# Patient Record
Sex: Female | Born: 1975 | Race: White | Hispanic: No | Marital: Married | Smoking: Never smoker
Health system: Southern US, Community
[De-identification: ages and names within clinical notes are randomized; demographics above are authoritative.]

## PROBLEM LIST (undated history)

## (undated) DIAGNOSIS — K589 Irritable bowel syndrome without diarrhea: Secondary | ICD-10-CM

## (undated) DIAGNOSIS — J189 Pneumonia, unspecified organism: Secondary | ICD-10-CM

## (undated) DIAGNOSIS — F329 Major depressive disorder, single episode, unspecified: Secondary | ICD-10-CM

## (undated) DIAGNOSIS — I10 Essential (primary) hypertension: Secondary | ICD-10-CM

## (undated) DIAGNOSIS — F32A Depression, unspecified: Secondary | ICD-10-CM

## (undated) DIAGNOSIS — D219 Benign neoplasm of connective and other soft tissue, unspecified: Secondary | ICD-10-CM

## (undated) DIAGNOSIS — F419 Anxiety disorder, unspecified: Secondary | ICD-10-CM

## (undated) DIAGNOSIS — O09519 Supervision of elderly primigravida, unspecified trimester: Secondary | ICD-10-CM

## (undated) HISTORY — DX: Supervision of elderly primigravida, unspecified trimester: O09.519

## (undated) HISTORY — DX: Irritable bowel syndrome, unspecified: K58.9

## (undated) HISTORY — DX: Benign neoplasm of connective and other soft tissue, unspecified: D21.9

## (undated) HISTORY — PX: LUNG SURGERY: SHX703

## (undated) HISTORY — DX: Anxiety disorder, unspecified: F41.9

## (undated) HISTORY — DX: Essential (primary) hypertension: I10

## (undated) HISTORY — PX: WISDOM TOOTH EXTRACTION: SHX21

---

## 2011-05-23 ENCOUNTER — Encounter: Payer: Self-pay | Admitting: Internal Medicine

## 2011-05-23 ENCOUNTER — Ambulatory Visit (INDEPENDENT_AMBULATORY_CARE_PROVIDER_SITE_OTHER): Payer: Managed Care, Other (non HMO) | Admitting: Internal Medicine

## 2011-05-23 ENCOUNTER — Ambulatory Visit (INDEPENDENT_AMBULATORY_CARE_PROVIDER_SITE_OTHER)
Admission: RE | Admit: 2011-05-23 | Discharge: 2011-05-23 | Disposition: A | Discharge status: Home / Self Care | Payer: Managed Care, Other (non HMO) | Source: Ambulatory Visit | Attending: Internal Medicine | Admitting: Internal Medicine

## 2011-05-23 ENCOUNTER — Other Ambulatory Visit (INDEPENDENT_AMBULATORY_CARE_PROVIDER_SITE_OTHER): Payer: Managed Care, Other (non HMO)

## 2011-05-23 VITALS — BP 132/86 | HR 121 | Temp 100.2°F | Ht 63.0 in | Wt 162.6 lb

## 2011-05-23 DIAGNOSIS — J90 Pleural effusion, not elsewhere classified: Secondary | ICD-10-CM

## 2011-05-23 LAB — SEDIMENTATION RATE: Sed Rate: 116 mm/hr — ABNORMAL HIGH (ref 0–22)

## 2011-05-23 LAB — BASIC METABOLIC PANEL
Chloride: 97 mEq/L (ref 96–112)
Creatinine, Ser: 0.6 mg/dL (ref 0.4–1.2)
Sodium: 135 mEq/L (ref 135–145)

## 2011-05-23 LAB — CBC WITH DIFFERENTIAL/PLATELET
Eosinophils Absolute: 0.2 10*3/uL (ref 0.0–0.7)
Lymphocytes Relative: 7.9 % — ABNORMAL LOW (ref 12.0–46.0)
MCHC: 32.8 g/dL (ref 30.0–36.0)
MCV: 79.9 fl (ref 78.0–100.0)
Monocytes Absolute: 1.4 10*3/uL — ABNORMAL HIGH (ref 0.1–1.0)
Neutrophils Relative %: 83.4 % — ABNORMAL HIGH (ref 43.0–77.0)
Platelets: 787 10*3/uL — ABNORMAL HIGH (ref 150.0–400.0)
WBC: 19.5 10*3/uL (ref 4.5–10.5)

## 2011-05-23 MED ORDER — IOHEXOL 300 MG/ML  SOLN
80.0000 mL | Freq: Once | INTRAMUSCULAR | Status: AC | PRN
  Administered 2011-05-23: 80 mL via INTRAVENOUS

## 2011-05-23 NOTE — Patient Instructions (Signed)
Please see patient coordinator before you leave today  to schedule ct chest asap  Please remember to go to the lab  department downstairs for your tests - we will call you with the results when they are available.  Most likely ultrasound guided thoracentesis will be done tomorrow after we have a change to review your scan

## 2011-05-23 NOTE — Assessment & Plan Note (Addendum)
-   CT chest 05/23/2011 >1. Lingular and left lower lobe pneumonia with associated empyema.  Necrosis in the lingula is suspected. Follow-up to clearing is  recommended.  2. Small pericardial effusion.  3. Small right pleural effusion.   - ESR  116 05/23/2011   Probable CAP with parapneumonic effusion ? Complex vs frank empyema in pt who was on prednisone for UC at onset with outside possibility of UC of the lung less likely.  There is a rind that is thin at this point and probably needs early rather than late vats - discussed with Dr Edwyna Shell who kindly agreed to see her in the office 4/18.

## 2011-05-23 NOTE — Progress Notes (Signed)
  Subjective:    Patient ID: Taylor [name], female    DOB: Dec 18, 1975  MRN: [id number]  HPI  36 yowf never smoker with h/o watery eyes in pollen seasonx  many years and UC since age 36 with occ  flares requiring prednisone maybe once a year with last flare requiring which was stopped on May 16 2011  referred by by [facility] 05/23/2011 for dx of pneumonia.   05/23/2011 1st pulmonary ov/ last of March 2013  started coughing with mostly clear mucus then first April abupt onset  L ant / lat pleurtic CP April 4th dx pna rx avelox x 10 days seemed to help some with sob/ wheezing but cough only some better and no real change in fever pattern with cxr worse 05/23/2011  Per report with fluid present.  No sore throat, sinus complaints, dysphagia or nausea but poor appetite.    No dental work, no unusual exposure.   Sleeping ok without nocturnal  or early am exacerbation  of respiratory  C/o's  Also denies any obvious fluctuation of symptoms with weather or environmental changes or other aggravating or alleviating factors except as outlined above   Review of Systems  Constitutional: Positive for fever. Negative for unexpected weight change.  HENT: Positive for rhinorrhea. Negative for ear pain, nosebleeds, congestion, sore throat, sneezing, trouble swallowing, dental problem, postnasal drip and sinus pressure.   Eyes: Negative for redness and itching.  Respiratory: Positive for cough, chest tightness, shortness of breath and wheezing.   Cardiovascular: Negative for chest pain, palpitations and leg swelling.  Gastrointestinal: Negative for nausea and vomiting.  Genitourinary: Negative for dysuria.  Musculoskeletal: Negative for joint swelling.  Skin: Negative for rash.  Neurological: Negative for headaches.  Hematological: Does not bruise/bleed easily.  Psychiatric/Behavioral: Negative for dysphoric mood. The patient is not nervous/anxious.        Objective:   Physical Exam   Pleasant amb wf  nad  Wt 162 05/23/2011   HEENT: nl dentition, turbinates, and orophanx. Nl external ear canals without cough reflex   NECK :  without JVD/Nodes/TM/ nl carotid upstrokes bilaterally   LUNGS: no acc muscle use, no cough on insp or exp maneuvers, decreased bs with dullness L base extending one third to one half way up   CV:  RRR  no s3 or murmur or increase in P2, no edema   ABD:  soft and nontender with nl excursion in the supine position. No bruits or organomegaly, bowel sounds nl  MS:  warm without deformities, calf tenderness, cyanosis or clubbing  SKIN: warm and dry without lesions    NEURO:  alert, approp, no deficits     cxr 05/23/2011 reported to show large L effusion  Assessment & Plan:

## 2011-05-24 ENCOUNTER — Ambulatory Visit (INDEPENDENT_AMBULATORY_CARE_PROVIDER_SITE_OTHER): Payer: Managed Care, Other (non HMO) | Admitting: Thoracic Surgery

## 2011-05-24 ENCOUNTER — Encounter: Payer: Self-pay | Admitting: Thoracic Surgery

## 2011-05-24 DIAGNOSIS — J869 Pyothorax without fistula: Secondary | ICD-10-CM

## 2011-05-24 NOTE — Progress Notes (Signed)
PCP is Beverley Fiedler, MD, MD Referring Provider is Nyoka Cowden, MD  Chief Complaint  Patient presents with  . Empyema    eval and treat    HPI: This 36 year old Caucasian female has a long history of ulcerative colitis since age 68. Recently he was placed on prednisone in addition to [place]. She developed upper respiratory infection and went to her primary care physician where she was found to have pneumonia on chest x-ray. This pneumonia was in the left lower lobe. She was treated with Avelox but continued to have temperature and returned to her primary care physician. Repeat chest x-ray revealed a fluid was loculations on the left side CT scan shows a loculated left pleural effusion. She's this continues to have fever with a temperature 101 today the CT scan is compatible with a subacute to chronic empyema with a thickened pleural rind and entrapment of the left lower lobe. She is referred here for treatment Past Medical History  Diagnosis Date  . Ulcerative colitis     Past Surgical History  Procedure Date  . Wisdom tooth extraction     Family History  Problem Relation Age of Onset  . Emphysema Paternal Grandfather   . Emphysema Paternal Grandmother   . Pancreatic cancer Maternal Grandmother   . Lung cancer Paternal Grandfather   . Lung cancer Paternal Grandmother     Social History History  Substance Use Topics  . Smoking status: Never Smoker   . Smokeless tobacco: Never Used  . Alcohol Use: Yes     rarely    Current Outpatient Prescriptions  Medication Sig Dispense Refill  . acetaminophen (TYLENOL) 500 MG tablet Take 500 mg by mouth as needed.      Marland Kitchen FLUoxetine (PROZAC) 40 MG capsule Take 40 mg by mouth daily.      . mesalamine (LIALDA) 1.2 G EC tablet Take 2,400 mg by mouth daily with breakfast.      . Multiple Vitamins-Minerals (MULTIVITAMIN WITH MINERALS) tablet Take 1 tablet by mouth daily.       No current facility-administered medications for this visit.    Facility-Administered Medications Ordered in Other Visits  Medication Dose Route Frequency Provider Last Rate Last Dose  . iohexol (OMNIPAQUE) 300 MG/ML solution 80 mL  80 mL Intravenous Once PRN Medication Radiologist, MD   80 mL at 05/23/11 1532    No Known Allergies  Review of Systems  Constitutional: Positive for fever and chills.  HENT: Positive for congestion.   Eyes: Negative.   Respiratory: Positive for cough, chest tightness and shortness of breath.   Cardiovascular: Negative.   Gastrointestinal: Positive for diarrhea.  Genitourinary: Negative.   Musculoskeletal: Positive for arthralgias.  Skin: Negative.   Neurological: Negative.   Hematological: Negative.   Psychiatric/Behavioral: Negative.     BP 135/96  Pulse 122  Temp(Src) 101.1 F (38.4 C) (Oral)  Resp 16  Ht 5\' 3"  (1.6 m)  Wt 160 lb (72.576 kg)  BMI 28.34 kg/m2  SpO2 95%  LMP 05/18/2011 Physical Exam  Constitutional: She is oriented to person, place, and time. She appears well-developed and well-nourished.  HENT:  Head: Normocephalic and atraumatic.  Right Ear: External ear normal.  Left Ear: External ear normal.  Nose: Nose normal.  Mouth/Throat: No oropharyngeal exudate.  Eyes: Conjunctivae are normal. Pupils are equal, round, and reactive to light.  Neck: Normal range of motion. Neck supple.  Cardiovascular: Normal rate, normal heart sounds and intact distal pulses.   Pulmonary/Chest: She has decreased breath  sounds in the left middle field and the left lower field.  Abdominal: Soft. Bowel sounds are normal.  Musculoskeletal: Normal range of motion.  Neurological: She is alert and oriented to person, place, and time. She has normal reflexes.  Skin: Skin is warm and dry.  Psychiatric: She has a normal mood and affect. Her behavior is normal. Judgment and thought content normal.     Diagnostic Tests: CT scan showed loculated left pleural effusion pleural collapse of the left lower lobe  consistent with a left empyema   Impression: Lobe pneumonia with resultant empyema history of ulcerative colitis   Plan: Admission to hospital for intravenous antibiotics left VATS with decortication

## 2011-05-25 ENCOUNTER — Inpatient Hospital Stay (HOSPITAL_COMMUNITY): Payer: Managed Care, Other (non HMO)

## 2011-05-25 ENCOUNTER — Encounter (HOSPITAL_COMMUNITY): Payer: Self-pay | Admitting: *Deleted

## 2011-05-25 ENCOUNTER — Encounter (HOSPITAL_COMMUNITY): Payer: Self-pay | Admitting: Pharmacy Technician

## 2011-05-25 ENCOUNTER — Inpatient Hospital Stay (HOSPITAL_COMMUNITY): Admission: RE | Admit: 2011-05-25 | Payer: Managed Care, Other (non HMO) | Source: Ambulatory Visit

## 2011-05-25 ENCOUNTER — Inpatient Hospital Stay (HOSPITAL_COMMUNITY)
Admission: RE | Admit: 2011-05-25 | Discharge: 2011-06-03 | DRG: 163 | Disposition: A | Discharge status: Home Health | Payer: Managed Care, Other (non HMO) | Source: Ambulatory Visit | Attending: Thoracic Surgery | Admitting: Thoracic Surgery

## 2011-05-25 DIAGNOSIS — E876 Hypokalemia: Secondary | ICD-10-CM | POA: Diagnosis not present

## 2011-05-25 DIAGNOSIS — I319 Disease of pericardium, unspecified: Secondary | ICD-10-CM | POA: Diagnosis present

## 2011-05-25 DIAGNOSIS — Z801 Family history of malignant neoplasm of trachea, bronchus and lung: Secondary | ICD-10-CM

## 2011-05-25 DIAGNOSIS — K5190 Ulcerative colitis, unspecified, without complications: Secondary | ICD-10-CM | POA: Diagnosis present

## 2011-05-25 DIAGNOSIS — J869 Pyothorax without fistula: Principal | ICD-10-CM | POA: Diagnosis present

## 2011-05-25 DIAGNOSIS — J15212 Pneumonia due to Methicillin resistant Staphylococcus aureus: Secondary | ICD-10-CM | POA: Diagnosis present

## 2011-05-25 DIAGNOSIS — Z79899 Other long term (current) drug therapy: Secondary | ICD-10-CM

## 2011-05-25 DIAGNOSIS — Z800 Family history of malignant neoplasm of digestive organs: Secondary | ICD-10-CM

## 2011-05-25 HISTORY — DX: Major depressive disorder, single episode, unspecified: F32.9

## 2011-05-25 HISTORY — DX: Depression, unspecified: F32.A

## 2011-05-25 LAB — CBC
HCT: 34.5 % — ABNORMAL LOW (ref 36.0–46.0)
Hemoglobin: 11.6 g/dL — ABNORMAL LOW (ref 12.0–15.0)
MCH: 26.5 pg (ref 26.0–34.0)
MCHC: 33.6 g/dL (ref 30.0–36.0)
MCV: 78.9 fL (ref 78.0–100.0)
RDW: 13.8 % (ref 11.5–15.5)

## 2011-05-25 LAB — SURGICAL PCR SCREEN
MRSA, PCR: POSITIVE — AB
Staphylococcus aureus: POSITIVE — AB

## 2011-05-25 LAB — URINALYSIS, ROUTINE W REFLEX MICROSCOPIC
Bilirubin Urine: NEGATIVE
Glucose, UA: NEGATIVE mg/dL
Hgb urine dipstick: NEGATIVE
Ketones, ur: NEGATIVE mg/dL
Nitrite: NEGATIVE
Specific Gravity, Urine: 1.018 (ref 1.005–1.030)
pH: 6.5 (ref 5.0–8.0)

## 2011-05-25 LAB — ABO/RH: ABO/RH(D): O POS

## 2011-05-25 LAB — PROTIME-INR: INR: 1.15 (ref 0.00–1.49)

## 2011-05-25 LAB — COMPREHENSIVE METABOLIC PANEL
Albumin: 2.5 g/dL — ABNORMAL LOW (ref 3.5–5.2)
BUN: 15 mg/dL (ref 6–23)
Creatinine, Ser: 0.6 mg/dL (ref 0.50–1.10)
GFR calc Af Amer: >90 mL/min (ref >90)
Glucose, Bld: 188 mg/dL — ABNORMAL HIGH (ref 70–99)
Total Bilirubin: 0.2 mg/dL — ABNORMAL LOW (ref 0.3–1.2)
Total Protein: 7.7 g/dL (ref 6.0–8.3)

## 2011-05-25 MED ORDER — ONDANSETRON HCL 4 MG/2ML IJ SOLN: 4.0000 mg | Freq: Four times a day (QID) | INTRAMUSCULAR | Status: DC | PRN

## 2011-05-25 MED ORDER — ACETAMINOPHEN 650 MG RE SUPP: 650.0000 mg | Freq: Four times a day (QID) | RECTAL | Status: DC | PRN

## 2011-05-25 MED ORDER — ACETAMINOPHEN 325 MG PO TABS
650.0000 mg | ORAL_TABLET | Freq: Four times a day (QID) | ORAL | Status: DC | PRN
  Administered 2011-05-25 (×2): 650 mg via ORAL
  Filled 2011-05-25 (×2): qty 2

## 2011-05-25 MED ORDER — PIPERACILLIN-TAZOBACTAM 3.375 G IVPB
3.3750 g | Freq: Three times a day (TID) | INTRAVENOUS | Status: DC
  Administered 2011-05-25 – 2011-05-28 (×10): 3.375 g via INTRAVENOUS
  Filled 2011-05-25 (×12): qty 50

## 2011-05-25 MED ORDER — FLUOXETINE HCL 20 MG PO CAPS
40.0000 mg | ORAL_CAPSULE | Freq: Every day | ORAL | Status: DC
  Filled 2011-05-25: qty 2

## 2011-05-25 MED ORDER — FENTANYL CITRATE 0.05 MG/ML IJ SOLN
50.0000 ug | INTRAMUSCULAR | Status: DC | PRN
  Administered 2011-05-31: 50 ug via INTRAVENOUS
  Filled 2011-05-25: qty 2

## 2011-05-25 MED ORDER — VANCOMYCIN HCL 1000 MG IV SOLR
750.0000 mg | Freq: Three times a day (TID) | INTRAVENOUS | Status: DC
  Administered 2011-05-25 – 2011-05-26 (×2): 750 mg via INTRAVENOUS
  Filled 2011-05-25 (×6): qty 750

## 2011-05-25 MED ORDER — DEXTROSE-NACL 5-0.45 % IV SOLN
INTRAVENOUS | Status: DC
  Administered 2011-05-25: 16:00:00 via INTRAVENOUS

## 2011-05-25 MED ORDER — ZOLPIDEM TARTRATE 5 MG PO TABS
5.0000 mg | ORAL_TABLET | Freq: Every evening | ORAL | Status: DC | PRN
  Administered 2011-05-25: 5 mg via ORAL
  Filled 2011-05-25: qty 1

## 2011-05-25 MED ORDER — MESALAMINE 1.2 G PO TBEC
2.4000 g | DELAYED_RELEASE_TABLET | Freq: Every day | ORAL | Status: DC
  Filled 2011-05-25: qty 2

## 2011-05-25 MED ORDER — FLUOXETINE HCL 20 MG PO CAPS
40.0000 mg | ORAL_CAPSULE | Freq: Every day | ORAL | Status: DC
  Administered 2011-05-26 – 2011-06-03 (×9): 40 mg via ORAL
  Filled 2011-05-25 (×9): qty 2

## 2011-05-25 MED ORDER — ONDANSETRON HCL 4 MG PO TABS
4.0000 mg | ORAL_TABLET | Freq: Four times a day (QID) | ORAL | Status: DC | PRN
  Administered 2011-06-02 – 2011-06-03 (×2): 4 mg via ORAL
  Filled 2011-05-25 (×2): qty 1

## 2011-05-25 MED ORDER — MUPIROCIN 2 % EX OINT
1.0000 "application " | TOPICAL_OINTMENT | Freq: Two times a day (BID) | CUTANEOUS | Status: AC
  Administered 2011-05-25 – 2011-05-30 (×9): 1 via NASAL
  Filled 2011-05-25 (×2): qty 22

## 2011-05-25 MED ORDER — FENTANYL CITRATE 0.05 MG/ML IJ SOLN: 50.0000 ug | INTRAMUSCULAR | Status: DC | PRN

## 2011-05-25 MED ORDER — MESALAMINE 1.2 G PO TBEC
2.4000 g | DELAYED_RELEASE_TABLET | Freq: Every day | ORAL | Status: DC
  Administered 2011-05-27 – 2011-06-03 (×8): 2.4 g via ORAL
  Filled 2011-05-25 (×10): qty 2

## 2011-05-25 MED ORDER — HYDROCODONE-ACETAMINOPHEN 5-325 MG PO TABS
1.0000 | ORAL_TABLET | ORAL | Status: DC | PRN
  Administered 2011-05-29 – 2011-06-01 (×3): 2 via ORAL
  Filled 2011-05-25 (×3): qty 2

## 2011-05-25 MED ORDER — CHLORHEXIDINE GLUCONATE CLOTH 2 % EX PADS
6.0000 | MEDICATED_PAD | Freq: Every day | CUTANEOUS | Status: DC
  Administered 2011-05-26: 6 via TOPICAL

## 2011-05-25 MED ORDER — MIDAZOLAM HCL 2 MG/2ML IJ SOLN: 1.0000 mg | INTRAMUSCULAR | Status: DC | PRN

## 2011-05-25 NOTE — Progress Notes (Signed)
ANTIBIOTIC CONSULT NOTE - INITIAL  Pharmacy Consult for Vancomycin Indication: pneumonia with resultant empyema  No Known Allergies  Patient Measurements: Height: 5\' 3"  (160 cm) Weight: 147 lb 11.3 oz (67 kg) IBW/kg (Calculated) : 52.4    Vital Signs: Temp: 100.2 F (37.9 C) (04/19 1331) BP: 128/98 mmHg (04/19 1331) Pulse Rate: 115  (04/19 1331) Intake/Output from previous day:   Intake/Output from this shift:    Labs:  Basename 05/23/11 1417  WBC 19.5 Repeated and verified X2.*  HGB 10.5*  PLT 787.0 Repeated and verified X2.*  LABCREA --  CREATININE 0.6   Estimated Creatinine Clearance: 87.6 ml/min (by C-G formula based on Cr of 0.6). No results found for this basename: VANCOTROUGH:2,VANCOPEAK:2,VANCORANDOM:2,GENTTROUGH:2,GENTPEAK:2,GENTRANDOM:2,TOBRATROUGH:2,TOBRAPEAK:2,TOBRARND:2,AMIKACINPEAK:2,AMIKACINTROU:2,AMIKACIN:2, in the last 72 hours   Microbiology: No results found for this or any previous visit (from the past 720 hour(s)).  Medical History: Past Medical History  Diagnosis Date  . Ulcerative colitis     Medications:  Prescriptions prior to admission  Medication Sig Dispense Refill  . FLUoxetine (PROZAC) 40 MG capsule Take 40 mg by mouth daily.      Marland Kitchen ibuprofen (ADVIL,MOTRIN) 200 MG tablet Take 400 mg by mouth every 6 (six) hours as needed. For pain      . mesalamine (LIALDA) 1.2 G EC tablet Take 2,400 mg by mouth daily with breakfast.       Scheduled:    . FLUoxetine  40 mg Oral Daily  . mesalamine  2.4 g Oral Q breakfast  . piperacillin-tazobactam (ZOSYN)  IV  3.375 g Intravenous Q8H  . DISCONTD: FLUoxetine  40 mg Oral Daily  . DISCONTD: mesalamine  2.4 g Oral Q breakfast   Anti-infectives     Start     Dose/Rate Route Frequency Ordered Stop   05/25/11 1500   piperacillin-tazobactam (ZOSYN) IVPB 3.375 g        3.375 g 12.5 mL/hr over 240 Minutes Intravenous 3 times per day 05/25/11 1350           Assessment: 36 y.o. Female with long  h/o ulcerative colitis since age 88. Recently treated with Avelox outpaitient for PNA . Returned to PCP due to continued fever, temp to 101.1 yesterday.  CT scan showed empyema. Zosyn 3.375 gm IV q8h also started. SCr 0.6, CrCl~87 ml/min,  Weight 67 kg.   Goal of Therapy:  Vancomycin trough level 15-20 mcg/ml  Plan:  Vancomycin 750mg  IV q8hr. Monitor steady state vancomycin trough, renal function, and cultures.   Arman Filter 05/25/2011,2:10 PM

## 2011-05-25 NOTE — Progress Notes (Signed)
                                             The patient is admitted with an empyema. Plan drainage in am. Patient agrees to surgery and understands risks.  Procedure(s) (LRB): VIDEO ASSISTED THORACOSCOPY (VATS)/EMPYEMA (Left) Subjective:   Objective: Vital signs in last 24 hours: Temp:  [100.2 F (37.9 C)] 100.2 F (37.9 C) (04/19 1331) Pulse Rate:  [115] 115  (04/19 1331) Cardiac Rhythm:  [-]  Resp:  [18] 18  (04/19 1331) BP: (128)/(98) 128/98 mmHg (04/19 1331) SpO2:  [95 %] 95 % (04/19 1331) Weight:  [67 kg (147 lb 11.3 oz)] 67 kg (147 lb 11.3 oz) (04/19 1331)  Hemodynamic parameters for last 24 hours:    Intake/Output from previous day:   Intake/Output this shift:    General appearance: alert Lungs: diminished breath sounds LLL and dullness to percussion LLL  Lab Results:  Basename 05/25/11 1400 05/23/11 1417  WBC 22.3* 19.5 Repeated and verified X2.*  HGB 11.6* 10.5*  HCT 34.5* 32.1*  PLT 890* 787.0 Repeated and verified X2.*   BMET:  Basename 05/23/11 1417  NA 135  K 4.4  CL 97  CO2 28  GLUCOSE 118*  BUN 21  CREATININE 0.6  CALCIUM 8.7    PT/INR:  Basename 05/25/11 1400  LABPROT 14.9  INR 1.15   ABG No results found for this basename: phart, pco2, po2, hco3, tco2, acidbasedef, o2sat   CBG (last 3)  No results found for this basename: GLUCAP:3 in the last 72 hours  Assessment/Plan: S/P Procedure(s) (LRB): VIDEO ASSISTED THORACOSCOPY (VATS)/EMPYEMA (Left) Plan surgery in am.   LOS: 0 days    Taylor [name] [facility] 05/25/2011

## 2011-05-25 NOTE — H&P (Signed)
Taylor [name] is an 36 y.o. female. 03/04/1975 WRU:[id number]   Chief Complaint:  Left lower lobe pneumonia and left empyema  History of Presenting Illness:  This is  A 36 year old Caucasian female who has a long history of ulcerative colitis (since age 21). Recently,she was placed on prednisone, in addition to [place]. She developed upper respiratory infection around the first week of April. She went to her primary care physician, Dr. Barbaraann Barthel. A  chest x-ray showed her to have left lower lobe pneumonia. She was treated with Avelox for about 10 days, but continued to have a temperature. She returned to her primary care physician again. A repeat chest x-ray revealed the left sided fluid to be loculated. A CT scan of the chest then showed a large, loculated left pleural effusion, lingular and left lower lobe pneumonia with associated empyema, and a small right pleural and pericardial effusion. She continued to have fever with a temperature 101 05/23/2011.She was initially seen by Dr. Sherene Sires and was then referred to Dr. Edwyna Shell. 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 Dr. Edwyna Shell discussed the need for a left VATS and possible decortication.Potential risks, benefits, and complications of the surgery were discussed with the patient and she agreed to proceed. She was admitted to [facility] on 05/25/2011.She will undergo her surgery in the am.   Past Medical History  Diagnosis Date  . Ulcerative colitis     Past Surgical History  Procedure Date  . Wisdom tooth extraction     Family History  Problem Relation Age of Onset  . Emphysema Paternal Grandfather   . Emphysema Paternal Grandmother   . Pancreatic cancer Maternal Grandmother   . Lung cancer Paternal Grandfather   . Lung cancer Paternal Grandmother    Social History:  reports that she has never  smoked. She has never used smokeless tobacco. She reports that she drinks alcohol. She reports that she does not use illicit drugs.  Allergies: No Known Allergies  Medications Prior to Admission  Medication Dose Route Frequency Provider Last Rate Last Dose  . acetaminophen (TYLENOL) tablet 650 mg  650 mg Oral Q6H PRN Ardelle Balls, PA       Or  . acetaminophen (TYLENOL) suppository 650 mg  650 mg Rectal Q6H PRN Ardelle Balls, PA      . dextrose 5 %-0.45 % sodium chloride infusion   Intravenous Continuous Ardelle Balls, PA      . HYDROcodone-acetaminophen (NORCO) 5-325 MG per tablet 1-2 tablet  1-2 tablet Oral Q4H PRN Ardelle Balls, PA      . iohexol (OMNIPAQUE) 300 MG/ML solution 80 mL  80 mL Intravenous Once PRN Medication Radiologist, MD   80 mL at 05/23/11 1532  . ondansetron (ZOFRAN) tablet 4 mg  4 mg Oral Q6H PRN Ardelle Balls, PA       Or  . ondansetron [facility]) injection 4 mg  4 mg Intravenous Q6H PRN Ardelle Balls, PA      . piperacillin-tazobactam (ZOSYN) IVPB 3.375 g  3.375 g Intravenous Q8H Ardelle Balls, PA       Medications Prior to Admission  Medication Sig Dispense Refill  . FLUoxetine (PROZAC) 40 MG capsule Take 40 mg by mouth daily.      Marland Kitchen ibuprofen (ADVIL,MOTRIN) 200 MG tablet Take 400 mg by mouth every 6 (  six) hours as needed. For pain      . mesalamine (LIALDA) 1.2 G EC tablet Take 2,400 mg by mouth daily with breakfast.       Review of Systems: Constitutional: Positive for fever and chills. Negative for unexpected weight change.  HENT:  Negative for ear pain, nosebleeds, congestion, sore throat, sneezing, trouble swallowing, dental problem, postnasal drip and sinus pressure.  Eyes: Negative for redness and itching.  Respiratory: Positive for cough, chest tightness, shortness of breath .  Cardiovascular: Negative for chest pain, palpitations and leg swelling.  Gastrointestinal: Negative for nausea and vomiting.Positive  for ecreased appetite.  Genitourinary: Negative for dysuria.  Musculoskeletal: Negative for joint swelling.  Skin: Negative for rash.  Neurological: Negative for headaches.  Hematological: Does not bruise/bleed easily.  Psychiatric/Behavioral: Negative for dysphoric mood. The patient is not nervous/anxious.   Vital Signs: Blood pressure 128/98, pulse 115, temperature 100.2 F (37.9 C), resp. rate 18, height 5\' 3"  (1.6 m), weight 147 lb 11.3 oz (67 kg), last menstrual period 05/18/2011, SpO2 95.00%.   Physical Exam: Constitutional: She is oriented to person, place, and time. She appears well-developed and well-nourished.  HENT:  Head: Normocephalic and atraumatic.  Eyes: EOMI, PERRLA, sclera are non icteric. Mouth/Throat: No oropharyngeal exudate.  Neck: Normal range of motion. Neck supple.  Cardiovascular: Slightly tachycardic;no murmur or rub. Pulmonary/Chest: She has decreased breath sounds in the left middle field and the left lower field.  Abdominal: Soft. Bowel sounds are normal.  Neurological: She is alert and oriented to person, place, and time. Skin: Skin is warm and dry.  Psychiatric: She has a normal mood and affect.     Results for orders placed in visit on 05/23/11 (from the past 48 hour(s))  CBC WITH DIFFERENTIAL     Status: Abnormal   Collection Time   05/23/11  2:17 PM      Component Value Range Comment   WBC 19.5 Repeated and verified X2. (*) 4.5 - 10.5 (K/uL)    RBC 4.01  3.87 - 5.11 (Mil/uL)    Hemoglobin 10.5 (*) 12.0 - 15.0 (g/dL)    HCT 81.1 (*) 91.4 - 46.0 (%)    MCV 79.9  78.0 - 100.0 (fl)    MCHC 32.8  30.0 - 36.0 (g/dL)    RDW 78.2  95.6 - 21.3 (%)    Platelets 787.0 Repeated and verified X2. (*) 150.0 - 400.0 (K/uL)    Neutrophils Relative 83.4 (*) 43.0 - 77.0 (%)    Lymphocytes Relative 7.9 (*) 12.0 - 46.0 (%)    Monocytes Relative 7.4  3.0 - 12.0 (%)    Eosinophils Relative 1.0  0.0 - 5.0 (%)    Basophils Relative 0.3  0.0 - 3.0 (%)    Neutro  Abs 16.2 (*) 1.4 - 7.7 (K/uL)    Lymphs Abs 1.5  0.7 - 4.0 (K/uL)    Monocytes Absolute 1.4 (*) 0.1 - 1.0 (K/uL)    Eosinophils Absolute 0.2  0.0 - 0.7 (K/uL)    Basophils Absolute 0.1  0.0 - 0.1 (K/uL)   SEDIMENTATION RATE     Status: Abnormal   Collection Time   05/23/11  2:17 PM      Component Value Range Comment   Sed Rate 116 (*) 0 - 22 (mm/hr)   BASIC METABOLIC PANEL     Status: Abnormal   Collection Time   05/23/11  2:17 PM      Component Value Range Comment   Sodium 135  135 -  145 (mEq/L)    Potassium 4.4  3.5 - 5.1 (mEq/L)    Chloride 97  96 - 112 (mEq/L)    CO2 28  19 - 32 (mEq/L)    Glucose, Bld 118 (*) 70 - 99 (mg/dL)    BUN 21  6 - 23 (mg/dL)    Creatinine, Ser 0.6  0.4 - 1.2 (mg/dL)    Calcium 8.7  8.4 - 10.5 (mg/dL)    GFR 914.78  >29.56 (mL/min)    Ct Chest W Contrast  05/23/2011  *RADIOLOGY REPORT*  Clinical Data: Recent pneumonia with persistent cough, fever and malaise.  Pleural effusion and chest radiograph.  CT CHEST WITH CONTRAST  Technique:  Multidetector CT imaging of the chest was performed following the standard protocol during bolus administration of intravenous contrast.  Contrast: 80mL OMNIPAQUE IOHEXOL 300 MG/ML  SOLN  Comparison: None.  Findings: Mediastinal lymph nodes are not enlarged by CT size criteria.  No axillary adenopathy.  Heart is at the upper limits of normal in size.  Small pericardial effusion.  There is a large loculated left pleural effusion with a thin rind of pleural high attenuation.  Lingular and left lower lobe air space consolidation with heterogeneity in the lingula. Mild volume loss in the apical posterior segment left upper lobe.  Minimal atelectasis in the right lower lobe.  Tiny right pleural effusion. Airway is unremarkable.  Incidental imaging of the upper abdomen shows no acute findings. No worrisome lytic or sclerotic lesions.  IMPRESSION:  1. Lingular and left lower lobe pneumonia with associated empyema. Necrosis in the lingula is  suspected. Follow-up to clearing is recommended. 2.  Small pericardial effusion. 3.  Small right pleural effusion.  Original Report Authenticated By: Reyes Ivan, M.D.   Assessment/Plan 1.Left lower lobe pneumonia 2.Empyema 3.Patient is going to undergo a left VATS and possible decortication by Dr. Edwyna Shell on 05/26/2011.  Ardelle Balls, PA-C 05/25/2011, 1:54 PM

## 2011-05-26 ENCOUNTER — Encounter (HOSPITAL_COMMUNITY): Payer: Self-pay | Admitting: Anesthesiology

## 2011-05-26 ENCOUNTER — Inpatient Hospital Stay (HOSPITAL_COMMUNITY): Payer: Managed Care, Other (non HMO)

## 2011-05-26 ENCOUNTER — Encounter (HOSPITAL_COMMUNITY)
Admission: RE | Disposition: A | Discharge status: Home Health | Payer: Self-pay | Source: Ambulatory Visit | Attending: Thoracic Surgery

## 2011-05-26 ENCOUNTER — Inpatient Hospital Stay (HOSPITAL_COMMUNITY): Payer: Managed Care, Other (non HMO) | Admitting: Anesthesiology

## 2011-05-26 DIAGNOSIS — J869 Pyothorax without fistula: Secondary | ICD-10-CM

## 2011-05-26 HISTORY — PX: THORACOTOMY: SHX5074

## 2011-05-26 LAB — GLUCOSE, CAPILLARY: Glucose-Capillary: 127 mg/dL — ABNORMAL HIGH (ref 70–99)

## 2011-05-26 SURGERY — VIDEO ASSISTED THORACOSCOPY (VATS)/EMPYEMA
Anesthesia: General | Laterality: Left | Wound class: Clean Contaminated

## 2011-05-26 MED ORDER — OXYCODONE-ACETAMINOPHEN 5-325 MG PO TABS
1.0000 | ORAL_TABLET | ORAL | Status: DC | PRN
Start: 2011-05-26 — End: 2011-06-03
  Administered 2011-05-26 (×4): 1 via ORAL
  Administered 2011-05-27 – 2011-06-01 (×4): 2 via ORAL
  Filled 2011-05-26 (×6): qty 2
  Filled 2011-05-26 (×2): qty 1
  Filled 2011-05-26 (×6): qty 2
  Filled 2011-05-26: qty 1
  Filled 2011-05-26 (×5): qty 2

## 2011-05-26 MED ORDER — ONDANSETRON HCL 4 MG/2ML IJ SOLN
INTRAMUSCULAR | Status: DC | PRN
  Administered 2011-05-26: 4 mg via INTRAVENOUS

## 2011-05-26 MED ORDER — DIPHENHYDRAMINE HCL 50 MG/ML IJ SOLN
12.5000 mg | Freq: Four times a day (QID) | INTRAMUSCULAR | Status: DC | PRN
  Administered 2011-05-28 – 2011-05-29 (×4): 12.5 mg via INTRAVENOUS
  Filled 2011-05-26 (×4): qty 1

## 2011-05-26 MED ORDER — GLYCOPYRROLATE 0.2 MG/ML IJ SOLN
INTRAMUSCULAR | Status: DC | PRN
  Administered 2011-05-26: .6 mg via INTRAVENOUS

## 2011-05-26 MED ORDER — ONDANSETRON HCL 4 MG/2ML IJ SOLN: 4.0000 mg | Freq: Four times a day (QID) | INTRAMUSCULAR | Status: DC | PRN

## 2011-05-26 MED ORDER — DIPHENHYDRAMINE HCL 12.5 MG/5ML PO ELIX
12.5000 mg | ORAL_SOLUTION | Freq: Four times a day (QID) | ORAL | Status: DC | PRN
  Filled 2011-05-26: qty 5

## 2011-05-26 MED ORDER — SODIUM CHLORIDE 0.9 % IJ SOLN: 9.0000 mL | INTRAMUSCULAR | Status: DC | PRN

## 2011-05-26 MED ORDER — FENTANYL 10 MCG/ML IV SOLN
INTRAVENOUS | Status: DC
  Administered 2011-05-26 (×2): via INTRAVENOUS
  Administered 2011-05-26: 300 ug via INTRAVENOUS
  Administered 2011-05-26: 135 ug via INTRAVENOUS
  Administered 2011-05-27: 240 ug via INTRAVENOUS
  Administered 2011-05-27 (×3): via INTRAVENOUS
  Administered 2011-05-27: 190 ug via INTRAVENOUS
  Administered 2011-05-27: 187 ug via INTRAVENOUS
  Administered 2011-05-28: 180 ug via INTRAVENOUS
  Administered 2011-05-28: 13:00:00 via INTRAVENOUS
  Administered 2011-05-28: 180 ug via INTRAVENOUS
  Administered 2011-05-28 (×2): via INTRAVENOUS
  Administered 2011-05-28: 345 ug via INTRAVENOUS
  Administered 2011-05-28: 305 ug via INTRAVENOUS
  Filled 2011-05-26 (×10): qty 50

## 2011-05-26 MED ORDER — FLUOXETINE HCL 40 MG PO CAPS: 40.0000 mg | ORAL_CAPSULE | Freq: Every day | ORAL | Status: DC

## 2011-05-26 MED ORDER — OXYCODONE HCL 5 MG PO TABS
5.0000 mg | ORAL_TABLET | ORAL | Status: AC | PRN
  Administered 2011-05-27 (×3): 10 mg via ORAL
  Filled 2011-05-26 (×3): qty 2

## 2011-05-26 MED ORDER — VANCOMYCIN HCL 1000 MG IV SOLR
750.0000 mg | Freq: Three times a day (TID) | INTRAVENOUS | Status: DC
  Administered 2011-05-26 – 2011-05-28 (×5): 750 mg via INTRAVENOUS
  Filled 2011-05-26 (×6): qty 750

## 2011-05-26 MED ORDER — NEOSTIGMINE METHYLSULFATE 1 MG/ML IJ SOLN
INTRAMUSCULAR | Status: DC | PRN
  Administered 2011-05-26: 4 mg via INTRAVENOUS

## 2011-05-26 MED ORDER — BUPIVACAINE 0.5 % ON-Q PUMP SINGLE CATH 400 ML
INJECTION | Status: DC | PRN
  Administered 2011-05-26: 400 mL

## 2011-05-26 MED ORDER — BUPIVACAINE ON-Q PAIN PUMP (FOR ORDER SET NO CHG)
INJECTION | Status: DC
  Filled 2011-05-26: qty 1

## 2011-05-26 MED ORDER — LIDOCAINE HCL (CARDIAC) 20 MG/ML IV SOLN
INTRAVENOUS | Status: DC | PRN
  Administered 2011-05-26: 80 mg via INTRAVENOUS

## 2011-05-26 MED ORDER — MESALAMINE 1.2 G PO TBEC: 2400.0000 mg | DELAYED_RELEASE_TABLET | Freq: Every day | ORAL | Status: DC

## 2011-05-26 MED ORDER — TRAMADOL HCL 50 MG PO TABS
50.0000 mg | ORAL_TABLET | Freq: Four times a day (QID) | ORAL | Status: DC | PRN
  Administered 2011-05-28 – 2011-05-31 (×7): 100 mg via ORAL
  Filled 2011-05-26 (×7): qty 2

## 2011-05-26 MED ORDER — VANCOMYCIN HCL IN DEXTROSE 1-5 GM/200ML-% IV SOLN
1000.0000 mg | Freq: Two times a day (BID) | INTRAVENOUS | Status: AC
  Administered 2011-05-26: 1000 mg via INTRAVENOUS
  Filled 2011-05-26 (×2): qty 200

## 2011-05-26 MED ORDER — FENTANYL CITRATE 0.05 MG/ML IJ SOLN
INTRAMUSCULAR | Status: DC | PRN
  Administered 2011-05-26 (×2): 50 ug via INTRAVENOUS
  Administered 2011-05-26 (×4): 100 ug via INTRAVENOUS

## 2011-05-26 MED ORDER — ZOLPIDEM TARTRATE 5 MG PO TABS: 10.0000 mg | ORAL_TABLET | Freq: Every evening | ORAL | Status: DC | PRN

## 2011-05-26 MED ORDER — ROCURONIUM BROMIDE 100 MG/10ML IV SOLN
INTRAVENOUS | Status: DC | PRN
  Administered 2011-05-26: 50 mg via INTRAVENOUS
  Administered 2011-05-26: 20 mg via INTRAVENOUS

## 2011-05-26 MED ORDER — NALOXONE HCL 0.4 MG/ML IJ SOLN: 0.4000 mg | INTRAMUSCULAR | Status: DC | PRN

## 2011-05-26 MED ORDER — MIDAZOLAM HCL 5 MG/5ML IJ SOLN
INTRAMUSCULAR | Status: DC | PRN
  Administered 2011-05-26 (×2): 2 mg via INTRAVENOUS

## 2011-05-26 MED ORDER — BISACODYL 5 MG PO TBEC
10.0000 mg | DELAYED_RELEASE_TABLET | Freq: Every day | ORAL | Status: DC
  Administered 2011-05-27 – 2011-06-01 (×6): 10 mg via ORAL
  Filled 2011-05-26 (×7): qty 2

## 2011-05-26 MED ORDER — INSULIN ASPART 100 UNIT/ML ~~LOC~~ SOLN: 0.0000 [IU] | Freq: Four times a day (QID) | SUBCUTANEOUS | Status: DC

## 2011-05-26 MED ORDER — INSULIN ASPART 100 UNIT/ML ~~LOC~~ SOLN
0.0000 [IU] | Freq: Four times a day (QID) | SUBCUTANEOUS | Status: DC
  Administered 2011-05-26: 2 [IU] via SUBCUTANEOUS

## 2011-05-26 MED ORDER — PHENYLEPHRINE HCL 10 MG/ML IJ SOLN
10.0000 mg | INTRAVENOUS | Status: DC | PRN
  Administered 2011-05-26: 50 ug/min via INTRAVENOUS

## 2011-05-26 MED ORDER — POTASSIUM CHLORIDE IN NACL 20-0.9 MEQ/L-% IV SOLN
INTRAVENOUS | Status: DC
  Administered 2011-05-26 – 2011-05-29 (×7): via INTRAVENOUS
  Filled 2011-05-26 (×10): qty 1000

## 2011-05-26 MED ORDER — HYDROMORPHONE HCL PF 1 MG/ML IJ SOLN
0.2500 mg | INTRAMUSCULAR | Status: DC | PRN
  Administered 2011-05-26 (×2): 0.5 mg via INTRAVENOUS

## 2011-05-26 MED ORDER — CHLORHEXIDINE GLUCONATE CLOTH 2 % EX PADS
6.0000 | MEDICATED_PAD | Freq: Every day | CUTANEOUS | Status: DC
  Administered 2011-05-27 – 2011-06-03 (×8): 6 via TOPICAL

## 2011-05-26 MED ORDER — LACTATED RINGERS IV SOLN
INTRAVENOUS | Status: DC | PRN
  Administered 2011-05-26 (×2): via INTRAVENOUS

## 2011-05-26 MED ORDER — POTASSIUM CHLORIDE 10 MEQ/50ML IV SOLN
10.0000 meq | Freq: Every day | INTRAVENOUS | Status: DC | PRN
  Administered 2011-05-28 – 2011-06-01 (×6): 10 meq via INTRAVENOUS
  Filled 2011-05-26 (×2): qty 150

## 2011-05-26 MED ORDER — BUPIVACAINE-EPINEPHRINE 0.25% -1:200000 IJ SOLN
INTRAMUSCULAR | Status: DC | PRN
  Administered 2011-05-26: 30 mL

## 2011-05-26 MED ORDER — OXYCODONE-ACETAMINOPHEN 5-325 MG PO TABS
1.0000 | ORAL_TABLET | ORAL | Status: DC | PRN
Start: 2011-05-27 — End: 2011-06-03
  Administered 2011-05-27 – 2011-05-28 (×2): 2 via ORAL
  Administered 2011-05-28 (×4): 1 via ORAL
  Administered 2011-05-29 – 2011-06-03 (×24): 2 via ORAL
  Filled 2011-05-26 (×2): qty 2
  Filled 2011-05-26: qty 1
  Filled 2011-05-26 (×3): qty 2
  Filled 2011-05-26: qty 1
  Filled 2011-05-26: qty 2
  Filled 2011-05-26: qty 1
  Filled 2011-05-26 (×8): qty 2

## 2011-05-26 MED ORDER — SENNOSIDES-DOCUSATE SODIUM 8.6-50 MG PO TABS: 1.0000 | ORAL_TABLET | Freq: Every evening | ORAL | Status: DC | PRN

## 2011-05-26 MED ORDER — PHENYLEPHRINE HCL 10 MG/ML IJ SOLN
INTRAMUSCULAR | Status: DC | PRN
  Administered 2011-05-26 (×3): 80 ug via INTRAVENOUS
  Administered 2011-05-26: 40 ug via INTRAVENOUS
  Administered 2011-05-26 (×2): 80 ug via INTRAVENOUS

## 2011-05-26 MED ORDER — ONDANSETRON HCL 4 MG/2ML IJ SOLN
4.0000 mg | Freq: Four times a day (QID) | INTRAMUSCULAR | Status: DC | PRN
  Administered 2011-05-31 – 2011-06-02 (×4): 4 mg via INTRAVENOUS
  Filled 2011-05-26 (×4): qty 2

## 2011-05-26 MED ORDER — ALBUMIN HUMAN 5 % IV SOLN
INTRAVENOUS | Status: DC | PRN
  Administered 2011-05-26 (×2): via INTRAVENOUS

## 2011-05-26 MED ORDER — BUPIVACAINE 0.5 % ON-Q PUMP SINGLE CATH 400 ML
400.0000 mL | INJECTION | Status: AC
  Filled 2011-05-26: qty 400

## 2011-05-26 MED ORDER — 0.9 % SODIUM CHLORIDE (POUR BTL) OPTIME
TOPICAL | Status: DC | PRN
  Administered 2011-05-26: 1000 mL

## 2011-05-26 MED ORDER — PROPOFOL 10 MG/ML IV EMUL
INTRAVENOUS | Status: DC | PRN
  Administered 2011-05-26: 200 mg via INTRAVENOUS

## 2011-05-26 MED ORDER — ACETAMINOPHEN 10 MG/ML IV SOLN
1000.0000 mg | Freq: Four times a day (QID) | INTRAVENOUS | Status: AC
  Administered 2011-05-26 – 2011-05-27 (×4): 1000 mg via INTRAVENOUS
  Filled 2011-05-26 (×3): qty 100

## 2011-05-26 SURGICAL SUPPLY — 78 items
APPLICATOR TIP EXT COSEAL (VASCULAR PRODUCTS) IMPLANT
APPLIER CLIP ROT 10 11.4 M/L (STAPLE)
BENZOIN TINCTURE PRP APPL 2/3 (GAUZE/BANDAGES/DRESSINGS) ×2 IMPLANT
CANISTER SUCTION 2500CC (MISCELLANEOUS) ×2 IMPLANT
CATH HYDRAGLIDE XL THORACIC (CATHETERS) IMPLANT
CATH KIT ON Q 5IN SLV (PAIN MANAGEMENT) ×2 IMPLANT
CATH THORACIC 28FR (CATHETERS) IMPLANT
CATH THORACIC 28FR RT ANG (CATHETERS) IMPLANT
CATH THORACIC 36FR (CATHETERS) IMPLANT
CATH THORACIC 36FR RT ANG (CATHETERS) IMPLANT
CATH URET ROBINSON 16FR STRL (CATHETERS) IMPLANT
CLIP APPLIE ROT 10 11.4 M/L (STAPLE) IMPLANT
CLIP TI MEDIUM 6 (CLIP) ×2 IMPLANT
CLOTH BEACON ORANGE TIMEOUT ST (SAFETY) ×2 IMPLANT
CONN Y 3/8X3/8X3/8  BEN (MISCELLANEOUS) ×2
CONN Y 3/8X3/8X3/8 BEN (MISCELLANEOUS) ×2 IMPLANT
CONT SPEC 4OZ CLIKSEAL STRL BL (MISCELLANEOUS) ×4 IMPLANT
CONT SPECI 4OZ STER CLIK (MISCELLANEOUS) ×10 IMPLANT
COVER SURGICAL LIGHT HANDLE (MISCELLANEOUS) ×4 IMPLANT
DECANTER SPIKE VIAL GLASS SM (MISCELLANEOUS) ×2 IMPLANT
DERMABOND ADVANCED (GAUZE/BANDAGES/DRESSINGS) ×1
DERMABOND ADVANCED .7 DNX12 (GAUZE/BANDAGES/DRESSINGS) ×1 IMPLANT
DRAPE CAMERA CLOSED 9X96 (DRAPES) ×2 IMPLANT
DRAPE LAPAROSCOPIC ABDOMINAL (DRAPES) ×2 IMPLANT
DRAPE WARM FLUID 44X44 (DRAPE) ×2 IMPLANT
DRILL BIT 7/64X5 (BIT) IMPLANT
ELECT REM PT RETURN 9FT ADLT (ELECTROSURGICAL) ×2
ELECTRODE REM PT RTRN 9FT ADLT (ELECTROSURGICAL) ×1 IMPLANT
GLOVE BIO SURGEON STRL SZ 6.5 (GLOVE) ×2 IMPLANT
GLOVE BIOGEL PI IND STRL 7.0 (GLOVE) ×4 IMPLANT
GLOVE BIOGEL PI INDICATOR 7.0 (GLOVE) ×4
GLOVE ECLIPSE 7.0 STRL STRAW (GLOVE) ×2 IMPLANT
GLOVE SURG SIGNA 7.5 PF LTX (GLOVE) ×4 IMPLANT
GOWN BRE IMP PREV XXLGXLNG (GOWN DISPOSABLE) ×2 IMPLANT
GOWN STRL NON-REIN LRG LVL3 (GOWN DISPOSABLE) ×6 IMPLANT
HANDLE STAPLE ENDO GIA SHORT (STAPLE) ×1
HEMOSTAT SURGICEL 2X14 (HEMOSTASIS) IMPLANT
KIT BASIN OR (CUSTOM PROCEDURE TRAY) ×2 IMPLANT
KIT ROOM TURNOVER OR (KITS) ×2 IMPLANT
LIGASURE 5MM LAPAROSCOPIC (INSTRUMENTS) IMPLANT
NS IRRIG 1000ML POUR BTL (IV SOLUTION) ×6 IMPLANT
PACK CHEST (CUSTOM PROCEDURE TRAY) ×2 IMPLANT
PAD ARMBOARD 7.5X6 YLW CONV (MISCELLANEOUS) ×4 IMPLANT
RELOAD EGIA BLACK ROTIC 45MM (STAPLE) ×2 IMPLANT
RELOAD TRI 2.0 60 XTHK VAS SUL (STAPLE) ×4 IMPLANT
SEALANT PROGEL (MISCELLANEOUS) IMPLANT
SEALANT SURG COSEAL 4ML (VASCULAR PRODUCTS) IMPLANT
SEALANT SURG COSEAL 8ML (VASCULAR PRODUCTS) IMPLANT
SOLUTION ANTI FOG 6CC (MISCELLANEOUS) ×2 IMPLANT
SPONGE GAUZE 4X4 12PLY (GAUZE/BANDAGES/DRESSINGS) ×2 IMPLANT
SPONGE LAP 18X18 X RAY DECT (DISPOSABLE) ×2 IMPLANT
STAPLER ENDO GIA 12MM SHORT (STAPLE) ×1 IMPLANT
STAPLER VISISTAT 35W (STAPLE) ×2 IMPLANT
STRIP PERI DRY VERITAS 45 (STAPLE) IMPLANT
STRIP PERI DRY VERITAS 60 (STAPLE) IMPLANT
SUT PROLENE 3 0 SH DA (SUTURE) IMPLANT
SUT PROLENE 4 0 RB 1 (SUTURE)
SUT PROLENE 4-0 RB1 .5 CRCL 36 (SUTURE) IMPLANT
SUT SILK 0 FSL (SUTURE) ×12 IMPLANT
SUT SILK 2 0SH CR/8 30 (SUTURE) IMPLANT
SUT VIC AB 1 CTX 18 (SUTURE) ×4 IMPLANT
SUT VIC AB 1 CTX 36 (SUTURE) ×1
SUT VIC AB 1 CTX36XBRD ANBCTR (SUTURE) ×1 IMPLANT
SUT VIC AB 2-0 CTX 36 (SUTURE) ×2 IMPLANT
SUT VIC AB 3-0 MH 27 (SUTURE) ×12 IMPLANT
SUT VIC AB 3-0 X1 27 (SUTURE) ×2 IMPLANT
SUT VICRYL 2 TP 1 (SUTURE) ×2 IMPLANT
SYSTEM SAHARA CHEST DRAIN RE-I (WOUND CARE) ×2 IMPLANT
TAPE CLOTH 4X10 WHT NS (GAUZE/BANDAGES/DRESSINGS) ×2 IMPLANT
TAPE CLOTH SURG 6X10 WHT LF (GAUZE/BANDAGES/DRESSINGS) ×2 IMPLANT
TAPE UMBILICAL COTTON 1/8X30 (MISCELLANEOUS) ×2 IMPLANT
TIP APPLICATOR SPRAY EXTEND 16 (VASCULAR PRODUCTS) IMPLANT
TOWEL OR 17X24 6PK STRL BLUE (TOWEL DISPOSABLE) ×4 IMPLANT
TOWEL OR 17X26 10 PK STRL BLUE (TOWEL DISPOSABLE) ×4 IMPLANT
TRAP SPECIMEN MUCOUS 40CC (MISCELLANEOUS) ×4 IMPLANT
TRAY FOLEY CATH 14FRSI W/METER (CATHETERS) ×2 IMPLANT
TUNNELER SHEATH ON-Q 11GX8 (MISCELLANEOUS) ×2 IMPLANT
WATER STERILE IRR 1000ML POUR (IV SOLUTION) ×4 IMPLANT

## 2011-05-26 NOTE — Anesthesia Preprocedure Evaluation (Addendum)
Anesthesia Evaluation  Patient identified by MRN, date of birth, ID band Patient awake    Reviewed: Allergy & Precautions, H&P , NPO status , Patient's Chart, lab work & pertinent test results  Airway Mallampati: II TM Distance: >3 FB Neck ROM: Full    Dental  (+) Teeth Intact and Dental Advisory Given   Pulmonary pneumonia ,          Cardiovascular     Neuro/Psych Depression    GI/Hepatic PUD,   Endo/Other    Renal/GU      Musculoskeletal   Abdominal   Peds  Hematology   Anesthesia Other Findings   Reproductive/Obstetrics                           Anesthesia Physical Anesthesia Plan  ASA: II  Anesthesia Plan: General   Post-op Pain Management:    Induction: Intravenous  Airway Management Planned: Double Lumen EBT  Additional Equipment: Arterial line and CVP  Intra-op Plan:   Post-operative Plan: Extubation in OR  Informed Consent: I have reviewed the patients History and Physical, chart, labs and discussed the procedure including the risks, benefits and alternatives for the proposed anesthesia with the patient or authorized representative who has indicated his/her understanding and acceptance.     Plan Discussed with: CRNA and Surgeon  Anesthesia Plan Comments:         Anesthesia Quick Evaluation

## 2011-05-26 NOTE — Progress Notes (Signed)
Dr. Edwyna Shell notified of free air in abd per Dr. Margo Aye.

## 2011-05-26 NOTE — Interval H&P Note (Signed)
History and Physical Interval Note:  05/26/2011 6:55 AM  Taylor [name]  has presented today for surgery, with the diagnosis of empyema  The various methods of treatment have been discussed with the patient and family. After consideration of risks, benefits and other options for treatment, the patient has consented to  Procedure(s) (LRB): VIDEO ASSISTED THORACOSCOPY (VATS)/EMPYEMA (Left) as a surgical intervention .  The patients' history has been reviewed, patient examined, no change in status, stable for surgery.  I have reviewed the patients' chart and labs.  Questions were answered to the patient's satisfaction.     Cameron Proud

## 2011-05-26 NOTE — Anesthesia Postprocedure Evaluation (Signed)
Anesthesia Post Note  Patient: Taylor [name]  Procedure(s) Performed: Procedure(s) (LRB): VIDEO ASSISTED THORACOSCOPY (VATS)/EMPYEMA (Left) THORACOTOMY MAJOR (Left)  Anesthesia type: General  Patient location: PACU  Post pain: Pain level controlled and Adequate analgesia  Post assessment: Post-op Vital signs reviewed, Patient's Cardiovascular Status Stable, Respiratory Function Stable, Patent Airway and Pain level controlled  Last Vitals:  Filed Vitals:   05/26/11 0532  BP: 122/84  Pulse: 105  Temp: 36.7 C  Resp: 22    Post vital signs: Reviewed and stable  Level of consciousness: awake, alert  and oriented  Complications: No apparent anesthesia complications

## 2011-05-26 NOTE — Brief Op Note (Signed)
05/25/2011 - 05/26/2011  10:23 AM  PATIENT:  Taylor [name]  36 y.o. female  PRE-OPERATIVE DIAGNOSIS:  empyema  POST-OPERATIVE DIAGNOSIS:  Empuema  PROCEDURE:  Procedure(s) (LRB): VIDEO ASSISTED THORACOSCOPY (VATS)/EMPYEMA (Left) THORACOTOMY MAJOR (Left)  SURGEON:  Surgeon(s) and Role:    * Ines Bloomer, MD - Primary  PHYSICIAN ASSISTANT:   ASSISTANTS: Doree Fudge PAC   ANESTHESIA:   general  EBL:  Total I/O In: 4000 [I.V.:3500; IV Piggyback:500] Out: 400 [Blood:400]  BLOOD ADMINISTERED:none  DRAINS: Three Chest Tube(s) in the Left chest   LOCAL MEDICATIONS USED:  MARCAINE     SPECIMEN:  Aspirate  DISPOSITION OF SPECIMEN:  PATHOLOGY  COUNTS:  YES  TOURNIQUET:  * No tourniquets in log *  DICTATION: .Other Dictation: Dictation Number [id number]  PLAN OF CARE: Admit to inpatient   PATIENT DISPOSITION:  serious condition   Delay start of Pharmacological VTE agent (>24hrs) due to surgical blood loss or risk of bleeding: yes

## 2011-05-26 NOTE — Anesthesia Procedure Notes (Signed)
Procedure Name: Intubation Date/Time: 05/26/2011 8:12 AM Performed by: Sherie Don Pre-anesthesia Checklist: Patient identified, Emergency Drugs available, Suction available, Patient being monitored and Timeout performed Patient Re-evaluated:Patient Re-evaluated prior to inductionOxygen Delivery Method: Circle system utilized Preoxygenation: Pre-oxygenation with 100% oxygen Intubation Type: IV induction Ventilation: Mask ventilation without difficulty Laryngoscope Size: Mac and 3 Grade View: Grade II Tube type: Oral Endobronchial tube: Left and 37 Fr Number of attempts: 1 Airway Equipment and Method: Stylet Placement Confirmation: ETT inserted through vocal cords under direct vision,  positive ETCO2 and breath sounds checked- equal and bilateral Secured at: 31 cm Tube secured with: Tape Dental Injury: Teeth and Oropharynx as per pre-operative assessment

## 2011-05-26 NOTE — Preoperative (Signed)
Beta Blockers   Reason not to administer Beta Blockers:Not Applicable 

## 2011-05-26 NOTE — Transfer of Care (Signed)
Immediate Anesthesia Transfer of Care Note  Patient: Taylor [name]  Procedure(s) Performed: Procedure(s) (LRB): VIDEO ASSISTED THORACOSCOPY (VATS)/EMPYEMA (Left) THORACOTOMY MAJOR (Left)  Patient Location: PACU  Anesthesia Type: General  Level of Consciousness: sedated  Airway & Oxygen Therapy: Patient Spontanous Breathing and Patient connected to face mask  Post-op Assessment: Report given to PACU RN and Post -op Vital signs reviewed and stable  Post vital signs: Reviewed and stable  Complications: No apparent anesthesia complications

## 2011-05-26 NOTE — Op Note (Signed)
Taylor [name], [name]                ACCOUNT NO.:  [id number]  MEDICAL RECORD NO.:  [id number]  LOCATION:  [facility]                         FACILITY:  [facility]  PHYSICIAN:  Taylor [name], M.D. DATE OF BIRTH:  July 12, 1975  DATE OF PROCEDURE: DATE OF DISCHARGE:                              OPERATIVE REPORT   PREOPERATIVE DIAGNOSIS:  Empyema, left chest.  POSTOPERATIVE DIAGNOSIS:  Empyema, left chest.  OPERATION PERFORMED:  Left VATS, left thoracotomy, drainage of empyema with decortication.  SURGEON:  Taylor [name], M.D.  FIRST ASSISTANT:  Doree Fudge, PA-C.  ANESTHESIA:  General anesthesia.  HISTORY:  This 36 year old patient had a history of left lower lobe pneumonia and was treated with antibiotics, but over the course of a month, she has developed marked loculated fluid collection posteriorly with temperatures of 101 and 102, white count of 22,000.  She was admitted to the hospital and started on Zosyn and vancomycin.  She was MRSA positive and PCR.  DESCRIPTION OF PROCEDURE:  She was brought to the operating room for drainage and decortication.  After general anesthesia, dual-lumen tube was inserted in the left lung and was deflated.  She was turned to the left lateral thoracotomy position, prepped and draped in usual sterile manner.  Two trocar sites were made, one at the posterior axillary line at the 7th intercostal space, one at the anterior axillary line at the 6th intercostal space.  Trocar was inserted posteriorly and there was a large posterior loculated empyema, which fluid was removed and sent for culture and Gram stain.  The anterior trocar site was stuck and we could not really get a good trocar and a good place because of the amount of the left upper lobe as well as left lower lobe was stuck to the chest wall, so we proceeded with a decortication making anterior thoracotomy over the 5th intercostal space, splitting the serratus and partially dividing the  latissimus, entered the interspace and found that the left lingula was stuck to the diaphragm as well as the heart and it appeared that the left lingula matted in the place where the pneumonia developed because it appeared to be abscessed.  We did an extrapleural dissection of the left lingula off the chest wall, then dissected it off the diaphragm, and then superiorly up dissecting it off the epicardial fat. The area of the left lingula, that was appeared to be abscessed, was resected with the Covidien black 60 stapler with two applications and one application with 45 stapler.  We then turned our attention to the left lower lobe and did an extrapleural resection of the pleura and then dissected the left lower lobe up off the diaphragm.  We did a decortication and took the inferior portion of the left lower lobe stripping off the thickened pleura and removing the multiple areas of exudate. After all the pleura had then removed from the chest wall as well as we removed all the exudate and scarring out the left lower lobe, we re-expanded the lung and make sure it filled the space.  Several small tears in the lung were sutured with 3-0 Vicryl and then we placed chest tubes in place.  Through  the anterior and posterior trocar site, we put a #36 drapes and we made another incision between the rib angle 36 and costophrenic angle.  We sutured these in place with 0 silk. Marcaine block was done in the usual fashion.  Single On-Q was inserted in the usual fashion.  Chest was closed in layers with #2 pericostal, #1 Vicryl in the muscle layer, 2-0 Vicryl in subcutaneous tissue, and Ethicon skin clips.  The patient was returned to the recovery room in serious condition.     Taylor [name], M.D.     DPB/MEDQ  D:  05/26/2011  T:  05/26/2011  Job:  [id number]  cc:   Lutricia Feil, MD

## 2011-05-26 NOTE — Progress Notes (Signed)
[facility] BRIEF SICU PROGRESS NOTE  Day of Surgery  S/P Procedure(s) (LRB): VIDEO ASSISTED THORACOSCOPY (VATS)/EMPYEMA (Left) THORACOTOMY MAJOR (Left)   Feels okay.  Adequate analgesia NSR BP stable O2 sats 100 % on 4 L/min via [place] Minimal chest tube output, no air leak UOP adequate  Plan: Continue routine early postop  Taylor [name] H 05/26/2011 5:47 PM

## 2011-05-27 ENCOUNTER — Inpatient Hospital Stay (HOSPITAL_COMMUNITY): Payer: Managed Care, Other (non HMO)

## 2011-05-27 LAB — BASIC METABOLIC PANEL
BUN: 7 mg/dL (ref 6–23)
Calcium: 8.5 mg/dL (ref 8.4–10.5)
Creatinine, Ser: 0.61 mg/dL (ref 0.50–1.10)
GFR calc Af Amer: >90 mL/min (ref >90)
GFR calc non Af Amer: >90 mL/min (ref >90)

## 2011-05-27 LAB — POCT I-STAT 3, ART BLOOD GAS (G3+)
Acid-Base Excess: 1 mmol/L (ref 0.0–2.0)
O2 Saturation: 96 %
Patient temperature: 98.4
TCO2: 29 mmol/L (ref 0–100)

## 2011-05-27 LAB — CBC
HCT: 26.8 % — ABNORMAL LOW (ref 36.0–46.0)
MCHC: 31.7 g/dL (ref 30.0–36.0)
MCV: 81 fL (ref 78.0–100.0)
Platelets: 608 10*3/uL — ABNORMAL HIGH (ref 150–400)
RDW: 13.8 % (ref 11.5–15.5)

## 2011-05-27 LAB — GLUCOSE, CAPILLARY: Glucose-Capillary: 100 mg/dL — ABNORMAL HIGH (ref 70–99)

## 2011-05-27 MED ORDER — INSULIN ASPART 100 UNIT/ML ~~LOC~~ SOLN
0.0000 [IU] | Freq: Four times a day (QID) | SUBCUTANEOUS | Status: DC
  Administered 2011-05-28 – 2011-05-29 (×2): 2 [IU] via SUBCUTANEOUS

## 2011-05-27 MED ORDER — POLYSACCHARIDE IRON COMPLEX 150 MG PO CAPS
150.0000 mg | ORAL_CAPSULE | Freq: Every day | ORAL | Status: DC
  Administered 2011-05-27 – 2011-06-03 (×8): 150 mg via ORAL
  Filled 2011-05-27 (×8): qty 1

## 2011-05-27 NOTE — Progress Notes (Signed)
[facility] BRIEF SICU PROGRESS NOTE  1 Day Post-Op  S/P Procedure(s) (LRB): VIDEO ASSISTED THORACOSCOPY (VATS)/EMPYEMA (Left) THORACOTOMY MAJOR (Left)   Stable day  Plan: Continue current plan  Purcell Nails 05/27/2011 6:32 PM

## 2011-05-27 NOTE — Progress Notes (Signed)
                                              1 Day Post-Op Procedure(s) (LRB): VIDEO ASSISTED THORACOSCOPY (VATS)/EMPYEMA (Left) THORACOTOMY MAJOR (Left) Subjective: The patient is stable. White count is 19,000. All cultures showed staph aureus. I will continue Zosyn for another 24 hours and then mass infectious disease to see patient on cultures are final. Feels he has a very severe left upper lobe pneumonia with empyema. Will discontinue art line. Will leave in ICU. Objective: Vital signs in last 24 hours: Temp:  [97 F (36.1 C)-98.9 F (37.2 C)] 98 F (36.7 C) (04/21 0736) Pulse Rate:  [89-117] 99  (04/21 0800) Cardiac Rhythm:  [-] Sinus tachycardia (04/21 0800) Resp:  [13-26] 22  (04/21 0800) BP: (98-134)/(58-96) 115/82 mmHg (04/21 0800) SpO2:  [91 %-99 %] 96 % (04/21 0800) Arterial Line BP: (64-142)/(50-97) 112/74 mmHg (04/21 0800) Weight:  [69.854 kg (154 lb)-74.2 kg (163 lb 9.3 oz)] 74.2 kg (163 lb 9.3 oz) (04/21 0600)  Hemodynamic parameters for last 24 hours:    Intake/Output from previous day: 04/20 0701 - 04/21 0700 In: 8528.2 [P.O.:1060; I.V.:5893.2; IV Piggyback:1575] Out: 3460 [Urine:2410; Blood:400; Chest Tube:650] Intake/Output this shift:    General appearance: cooperative Lungs: diminished breath sounds LUL  Lab Results:  Basename 05/27/11 0430 05/25/11 1400  WBC 19.9* 22.3*  HGB 8.5* 11.6*  HCT 26.8* 34.5*  PLT 608* 890*   BMET:  Basename 05/27/11 0430 05/25/11 1539  NA 135 133*  K 4.0 4.4  CL 101 94*  CO2 26 26  GLUCOSE 94 188*  BUN 7 15  CREATININE 0.61 0.60  CALCIUM 8.5 9.1    PT/INR:  Basename 05/25/11 1400  LABPROT 14.9  INR 1.15   ABG    Component Value Date/Time   PHART 7.353 05/27/2011 0449   HCO3 27.3* 05/27/2011 0449   TCO2 29 05/27/2011 0449   O2SAT 96.0 05/27/2011 0449   CBG (last 3)   Basename 05/26/11 2334 05/26/11 1828  GLUCAP 101* 127*    Assessment/Plan: S/P Procedure(s) (LRB): VIDEO ASSISTED THORACOSCOPY  (VATS)/EMPYEMA (Left) THORACOTOMY MAJOR (Left) Discontinue art line. Leave in ICU.   LOS: 2 days    Myrtle Haller [facility] 05/27/2011

## 2011-05-28 ENCOUNTER — Inpatient Hospital Stay (HOSPITAL_COMMUNITY): Payer: Managed Care, Other (non HMO)

## 2011-05-28 ENCOUNTER — Encounter (HOSPITAL_COMMUNITY): Payer: Self-pay | Admitting: Thoracic Surgery

## 2011-05-28 DIAGNOSIS — A4902 Methicillin resistant Staphylococcus aureus infection, unspecified site: Secondary | ICD-10-CM

## 2011-05-28 DIAGNOSIS — J869 Pyothorax without fistula: Secondary | ICD-10-CM

## 2011-05-28 LAB — GLUCOSE, CAPILLARY
Glucose-Capillary: 108 mg/dL — ABNORMAL HIGH (ref 70–99)
Glucose-Capillary: 126 mg/dL — ABNORMAL HIGH (ref 70–99)
Glucose-Capillary: 86 mg/dL (ref 70–99)

## 2011-05-28 LAB — COMPREHENSIVE METABOLIC PANEL
Alkaline Phosphatase: 141 U/L — ABNORMAL HIGH (ref 39–117)
BUN: 4 mg/dL — ABNORMAL LOW (ref 6–23)
Creatinine, Ser: 0.53 mg/dL (ref 0.50–1.10)
GFR calc Af Amer: >90 mL/min (ref >90)
Glucose, Bld: 81 mg/dL (ref 70–99)
Potassium: 3.7 mEq/L (ref 3.5–5.1)
Total Protein: 6.1 g/dL (ref 6.0–8.3)

## 2011-05-28 LAB — BODY FLUID CULTURE

## 2011-05-28 LAB — CBC
HCT: 25.1 % — ABNORMAL LOW (ref 36.0–46.0)
Hemoglobin: 8 g/dL — ABNORMAL LOW (ref 12.0–15.0)
MCH: 25.7 pg — ABNORMAL LOW (ref 26.0–34.0)
MCHC: 31.9 g/dL (ref 30.0–36.0)
MCV: 80.7 fL (ref 78.0–100.0)

## 2011-05-28 LAB — TISSUE CULTURE

## 2011-05-28 LAB — VANCOMYCIN, TROUGH: Vancomycin Tr: 7.9 ug/mL — ABNORMAL LOW (ref 10.0–20.0)

## 2011-05-28 LAB — HIV ANTIBODY (ROUTINE TESTING W REFLEX): HIV: NONREACTIVE

## 2011-05-28 MED ORDER — CEFAZOLIN SODIUM 1-5 GM-% IV SOLN: 1.0000 g | Freq: Three times a day (TID) | INTRAVENOUS | Status: DC

## 2011-05-28 MED ORDER — FENTANYL 10 MCG/ML IV SOLN
INTRAVENOUS | Status: DC
  Administered 2011-05-28: 255 ug via INTRAVENOUS
  Administered 2011-05-29: 02:00:00 via INTRAVENOUS
  Administered 2011-05-29: 179 ug via INTRAVENOUS
  Administered 2011-05-29: 255 ug via INTRAVENOUS
  Administered 2011-05-29: 240 ug via INTRAVENOUS
  Administered 2011-05-29: 150 ug via INTRAVENOUS
  Administered 2011-05-29: 240 ug via INTRAVENOUS
  Administered 2011-05-29: 11:00:00 via INTRAVENOUS
  Administered 2011-05-29: 255 ug via INTRAVENOUS
  Administered 2011-05-29: 19:00:00 via INTRAVENOUS
  Administered 2011-05-30: 45 ug via INTRAVENOUS
  Administered 2011-05-30: 135 ug via INTRAVENOUS
  Administered 2011-05-30: 120 ug via INTRAVENOUS
  Administered 2011-05-30: 269 ug via INTRAVENOUS
  Administered 2011-05-30: 07:00:00 via INTRAVENOUS
  Administered 2011-05-30: 45 ug via INTRAVENOUS
  Administered 2011-05-31: 180 ug via INTRAVENOUS
  Administered 2011-05-31: 75 ug via INTRAVENOUS
  Administered 2011-05-31: 07:00:00 via INTRAVENOUS
  Filled 2011-05-28 (×5): qty 50

## 2011-05-28 MED ORDER — VANCOMYCIN HCL 1000 MG IV SOLR
1250.0000 mg | Freq: Three times a day (TID) | INTRAVENOUS | Status: DC
  Administered 2011-05-28 – 2011-06-01 (×12): 1250 mg via INTRAVENOUS
  Filled 2011-05-28 (×14): qty 1250

## 2011-05-28 MED ORDER — SODIUM CHLORIDE 0.9 % IJ SOLN
10.0000 mL | INTRAMUSCULAR | Status: DC | PRN
  Administered 2011-06-01 (×2): 10 mL
  Filled 2011-05-28: qty 10
  Filled 2011-05-28: qty 20

## 2011-05-28 MED ORDER — SODIUM CHLORIDE 0.9 % IJ SOLN
10.0000 mL | Freq: Two times a day (BID) | INTRAMUSCULAR | Status: DC
  Administered 2011-05-28 – 2011-06-03 (×9): 10 mL
  Filled 2011-05-28 (×2): qty 10

## 2011-05-28 NOTE — Progress Notes (Addendum)
                                              2 Days Post-Op Procedure(s) (LRB): VIDEO ASSISTED THORACOSCOPY (VATS)/EMPYEMA (Left) THORACOTOMY MAJOR (Left) Subjective: The patient is stable. Her temperature was a maximum of 100.8. White count still 19,000. Chest x-ray is unchanged. Minimal drainage. Air in abdomen resolving. We will ask infectious disease to see. We'll transfer to 3300. Will discontinue Foley and On-Q. Objective: Vital signs in last 24 hours: Temp:  [97.6 F (36.4 C)-100.8 F (38.2 C)] 97.6 F (36.4 C) (04/22 0729) Pulse Rate:  [93-119] 109  (04/22 0700) Cardiac Rhythm:  [-] Sinus tachycardia (04/21 2000) Resp:  [15-24] 21  (04/22 0700) BP: (115-148)/(76-105) 140/105 mmHg (04/22 0700) SpO2:  [94 %-99 %] 98 % (04/22 0700) Arterial Line BP: (112-121)/(74-79) 121/79 mmHg (04/21 0900) Weight:  [76.7 kg (169 lb 1.5 oz)] 76.7 kg (169 lb 1.5 oz) (04/22 0600)  Hemodynamic parameters for last 24 hours:    Intake/Output from previous day: 04/21 0701 - 04/22 0700 In: 4100.9 [P.O.:900; I.V.:2613.4; IV Piggyback:587.5] Out: 2700 [Urine:2350; Chest Tube:350] Intake/Output this shift:    General appearance: alert Lungs: diminished breath sounds LUL  Lab Results:  Basename 05/28/11 0500 05/27/11 0430  WBC 19.9* 19.9*  HGB 8.0* 8.5*  HCT 25.1* 26.8*  PLT 618* 608*   BMET:  Basename 05/28/11 0500 05/27/11 0430  NA 135 135  K 3.7 4.0  CL 99 101  CO2 27 26  GLUCOSE 81 94  BUN 4* 7  CREATININE 0.53 0.61  CALCIUM 8.5 8.5    PT/INR:  Basename 05/25/11 1400  LABPROT 14.9  INR 1.15   ABG    Component Value Date/Time   PHART 7.353 05/27/2011 0449   HCO3 27.3* 05/27/2011 0449   TCO2 29 05/27/2011 0449   O2SAT 96.0 05/27/2011 0449   CBG (last 3)   Basename 05/27/11 2342 05/27/11 1550 05/27/11 1142  GLUCAP 126* 100* 117*    Assessment/Plan: S/P Procedure(s) (LRB): VIDEO ASSISTED THORACOSCOPY (VATS)/EMPYEMA (Left) THORACOTOMY MAJOR (Left) transfer to 3300  and discontinue Foley and On-Q   LOS: 3 days    Derell Bruun PATRICK 05/28/2011

## 2011-05-28 NOTE — Consult Note (Signed)
Date of Admission:  05/25/2011  Date of Consult:  05/28/2011  Reason for Consult: staphylococcus aureus empyema and pneumonia Referring Physician: Dr. Edwyna Shell   HPI: Taylor [name] is an 36 y.o. female.  who has a long history of ulcerative colitis (since age 73). Recently,she was placed on prednisone, in addition to [place]. She developed upper respiratory infection around the first week of April. She went to her primary care physician, Dr. Barbaraann Barthel. A chest x-ray showed her to have left lower lobe pneumonia. She was treated with Avelox for about 10 days, but continued to have a temperature. She returned to her primary care physician again. A repeat chest x-ray revealed the left sided fluid to be loculated. A CT scan of the chest then showed a large, loculated left pleural effusion, lingular and left lower lobe pneumonia with associated empyema, and a small right pleural and pericardial effusion. She continued to have fever with a temperature 101 05/23/2011.She was initially seen by Dr. Sherene Sires and was then referred to Dr. Edwyna Shell. 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 Dr. Edwyna Shell  admitted her for  VATS which he performed on the 20th with decortication and drainage of empyema which is now growing Staph aureus (sensis pending). Pt does  Not think blood cultures had been done at PCP office originally.     Past Medical History  Diagnosis Date  . Ulcerative colitis   . Empyema lung   . Depression     Past Surgical History  Procedure Date  . Wisdom tooth extraction   ergies:   No Known Allergies   Medications: I have reviewed patients current medications as documented in Epic Anti-infectives     Start     Dose/Rate Route Frequency Ordered Stop   05/27/11 0000   vancomycin (VANCOCIN) 750 mg in sodium chloride 0.9 % 150 mL IVPB        750 mg 150 mL/hr over  60 Minutes Intravenous Every 8 hours 05/26/11 1537     05/26/11 1600   vancomycin (VANCOCIN) IVPB 1000 mg/200 mL premix        1,000 mg 200 mL/hr over 60 Minutes Intravenous Every 12 hours 05/26/11 1524 05/26/11 1754   05/25/11 1600   vancomycin (VANCOCIN) 750 mg in sodium chloride 0.9 % 150 mL IVPB  Status:  Discontinued        750 mg 150 mL/hr over 60 Minutes Intravenous Every 8 hours 05/25/11 1421 05/26/11 1537   05/25/11 1500  piperacillin-tazobactam (ZOSYN) IVPB 3.375 g       3.375 g 12.5 mL/hr over 240 Minutes Intravenous 3 times per day 05/25/11 1350            Social History:  reports that she has never smoked. She has never used smokeless tobacco. She reports that she drinks alcohol. She reports that she does not use illicit drugs.  Family History  Problem Relation Age of Onset  . Emphysema Paternal Grandfather   . Emphysema Paternal Grandmother   . Pancreatic cancer Maternal Grandmother   . Lung cancer Paternal Grandfather   . Lung cancer Paternal Grandmother     As in HPI and primary teams notes otherwise 12 point review of systems is negative  Blood pressure 131/90, pulse 103, temperature 97.6 F (36.4 C), temperature source Oral, resp. rate 21, height 5\' 3"  (1.6 m), weight 169 lb  1.5 oz (76.7 kg), last menstrual period 05/18/2011, SpO2 98.00%. General: Alert and awake, oriented x3, not in any acute distress. HEENT: anicteric sclera, pupils reactive to light and accommodation, EOMI, oropharynx clear and without exudate CVS regular rate, normal r,  no murmur rubs or gallops Chest:  Diminished breath sounds at the bases , chest tubes x 3 on left side and blood tinged fluid in bag Abdomen: soft nontender, nondistended, normal bowel sounds, Extremities: no  clubbing or edema noted bilaterally Skin: no rashes Neuro: nonfocal, strength and sensation intact   Results for orders placed during the hospital encounter of 05/25/11 (from the past 48 hour(s))  TISSUE CULTURE      Status: Normal (Preliminary result)   Collection Time   05/26/11 11:22 AM      Component Value Range Comment   Specimen Description TISSUE      Special Requests PT ON VANC,ZOSYN LEFT PLEURAL PEEL      Gram Stain PENDING      Culture        Value: MODERATE STAPHYLOCOCCUS AUREUS     Note: RIFAMPIN AND GENTAMICIN SHOULD NOT BE USED AS SINGLE DRUGS FOR TREATMENT OF STAPH INFECTIONS.   Report Status PENDING     BODY FLUID CULTURE     Status: Normal (Preliminary result)   Collection Time   05/26/11 11:25 AM      Component Value Range Comment   Specimen Description PLEURAL FLUID LEFT      Special Requests PT ON VANC,ZOSYN      Gram Stain        Value: MODERATE WBC PRESENT, PREDOMINANTLY PMN     FEW GRAM POSITIVE COCCI IN CLUSTERS   Culture        Value: MODERATE STAPHYLOCOCCUS AUREUS     Note: RIFAMPIN AND GENTAMICIN SHOULD NOT BE USED AS SINGLE DRUGS FOR TREATMENT OF STAPH INFECTIONS.   Report Status PENDING     ANAEROBIC CULTURE     Status: Normal (Preliminary result)   Collection Time   05/26/11 11:25 AM      Component Value Range Comment   Specimen Description PLEURAL FLUID LEFT      Special Requests PT ON VANC,ZOSYN      Gram Stain PENDING      Culture        Value: NO ANAEROBES ISOLATED; CULTURE IN PROGRESS FOR 5 DAYS   Report Status PENDING     GLUCOSE, CAPILLARY     Status: Abnormal   Collection Time   05/26/11  6:28 PM      Component Value Range Comment   Glucose-Capillary 127 (*) 70 - 99 (mg/dL)   GLUCOSE, CAPILLARY     Status: Abnormal   Collection Time   05/26/11 11:34 PM      Component Value Range Comment   Glucose-Capillary 101 (*) 70 - 99 (mg/dL)   CBC     Status: Abnormal   Collection Time   05/27/11  4:30 AM      Component Value Range Comment   WBC 19.9 (*) 4.0 - 10.5 (K/uL)    RBC 3.31 (*) 3.87 - 5.11 (MIL/uL)    Hemoglobin 8.5 (*) 12.0 - 15.0 (g/dL) DELTA CHECK NOTED   HCT 26.8 (*) 36.0 - 46.0 (%)    MCV 81.0  78.0 - 100.0 (fL)    MCH 25.7 (*) 26.0 -  34.0 (pg)    MCHC 31.7  30.0 - 36.0 (g/dL)    RDW 14.7  82.9 - 56.2 (%)  Platelets 608 (*) 150 - 400 (K/uL) DELTA CHECK NOTED  BASIC METABOLIC PANEL     Status: Normal   Collection Time   05/27/11  4:30 AM      Component Value Range Comment   Sodium 135  135 - 145 (mEq/L)    Potassium 4.0  3.5 - 5.1 (mEq/L)    Chloride 101  96 - 112 (mEq/L)    CO2 26  19 - 32 (mEq/L)    Glucose, Bld 94  70 - 99 (mg/dL)    BUN 7  6 - 23 (mg/dL)    Creatinine, Ser 1.61  0.50 - 1.10 (mg/dL)    Calcium 8.5  8.4 - 10.5 (mg/dL)    GFR calc non Af Amer >90  >90 (mL/min)    GFR calc Af Amer >90  >90 (mL/min)   POCT I-STAT 3, BLOOD GAS (G3+)     Status: Abnormal   Collection Time   05/27/11  4:49 AM      Component Value Range Comment   pH, Arterial 7.353  7.350 - 7.400     pCO2 arterial 49.1 (*) 35.0 - 45.0 (mmHg)    pO2, Arterial 85.0  80.0 - 100.0 (mmHg)    Bicarbonate 27.3 (*) 20.0 - 24.0 (mEq/L)    TCO2 29  0 - 100 (mmol/L)    O2 Saturation 96.0      Acid-Base Excess 1.0  0.0 - 2.0 (mmol/L)    Patient temperature 98.4 F      Collection site ARTERIAL LINE      Drawn by Operator      Sample type ARTERIAL     GLUCOSE, CAPILLARY     Status: Abnormal   Collection Time   05/27/11 11:42 AM      Component Value Range Comment   Glucose-Capillary 117 (*) 70 - 99 (mg/dL)   GLUCOSE, CAPILLARY     Status: Abnormal   Collection Time   05/27/11  3:50 PM      Component Value Range Comment   Glucose-Capillary 100 (*) 70 - 99 (mg/dL)   GLUCOSE, CAPILLARY     Status: Abnormal   Collection Time   05/27/11 11:42 PM      Component Value Range Comment   Glucose-Capillary 126 (*) 70 - 99 (mg/dL)   CBC     Status: Abnormal   Collection Time   05/28/11  5:00 AM      Component Value Range Comment   WBC 19.9 (*) 4.0 - 10.5 (K/uL)    RBC 3.11 (*) 3.87 - 5.11 (MIL/uL)    Hemoglobin 8.0 (*) 12.0 - 15.0 (g/dL)    HCT 09.6 (*) 04.5 - 46.0 (%)    MCV 80.7  78.0 - 100.0 (fL)    MCH 25.7 (*) 26.0 - 34.0 (pg)    MCHC  31.9  30.0 - 36.0 (g/dL)    RDW 40.9  81.1 - 91.4 (%)    Platelets 618 (*) 150 - 400 (K/uL)   COMPREHENSIVE METABOLIC PANEL     Status: Abnormal   Collection Time   05/28/11  5:00 AM      Component Value Range Comment   Sodium 135  135 - 145 (mEq/L)    Potassium 3.7  3.5 - 5.1 (mEq/L)    Chloride 99  96 - 112 (mEq/L)    CO2 27  19 - 32 (mEq/L)    Glucose, Bld 81  70 - 99 (mg/dL)    BUN 4 (*) 6 - 23 (  mg/dL)    Creatinine, Ser 4.09  0.50 - 1.10 (mg/dL)    Calcium 8.5  8.4 - 10.5 (mg/dL)    Total Protein 6.1  6.0 - 8.3 (g/dL)    Albumin 2.0 (*) 3.5 - 5.2 (g/dL)    AST 13  0 - 37 (U/L)    ALT 16  0 - 35 (U/L)    Alkaline Phosphatase 141 (*) 39 - 117 (U/L)    Total Bilirubin 0.2 (*) 0.3 - 1.2 (mg/dL)    GFR calc non Af Amer >90  >90 (mL/min)    GFR calc Af Amer >90  >90 (mL/min)   VANCOMYCIN, TROUGH     Status: Abnormal   Collection Time   05/28/11  8:30 AM      Component Value Range Comment   Vancomycin Tr 7.9 (*) 10.0 - 20.0 (ug/mL)       Component Value Date/Time   SDES PLEURAL FLUID LEFT 05/26/2011 1125   SDES PLEURAL FLUID LEFT 05/26/2011 1125   SPECREQUEST PT ON VANC,ZOSYN 05/26/2011 1125   SPECREQUEST PT ON VANC,ZOSYN 05/26/2011 1125   CULT  Value: MODERATE STAPHYLOCOCCUS AUREUS Note: RIFAMPIN AND GENTAMICIN SHOULD NOT BE USED AS SINGLE DRUGS FOR TREATMENT OF STAPH INFECTIONS. 05/26/2011 1125   CULT NO ANAEROBES ISOLATED; CULTURE IN PROGRESS FOR 5 DAYS 05/26/2011 1125   REPTSTATUS PENDING 05/26/2011 1125   REPTSTATUS PENDING 05/26/2011 1125   Dg Chest Port 1 View  05/28/2011  *RADIOLOGY REPORT*  Clinical Data: Empyema.  PORTABLE CHEST - 1 VIEW  Comparison: 04/21 and 05/26/2011  Findings: Three chest tubes remain in place.  Central venous catheter tip is in the superior vena cava.  No pneumothorax.  Slight increased density at the left lung base since the prior study.  The lower chest tube may be kinked at the side hole.  Right lung is clear.  Air under the right hemidiaphragm has  almost resolved.  IMPRESSION: Slight increased density at the left lung base.  Possible kink in the inferior chest tube.  Original Report Authenticated By: Gwynn Burly, M.D.   Dg Chest Port 1 View  05/27/2011  *RADIOLOGY REPORT*  Clinical Data: Left-sided chest tube.  Pneumothorax.  Shortness of breath.  Chest pains.  Empyema.  PORTABLE CHEST - 1 VIEW  Comparison: 05/26/2011  Findings: Cardiac enlargement with normal pulmonary vascularity. Infiltration or atelectasis in the left lung base with left pleural fluid and gas collections.  Three left chest tubes remain in place without change in position.  No visible significant left pneumothorax is residual.  The subcutaneous emphysema along the left chest wall.  Right central venous catheter tip is projected over the mid SVC region.  Again demonstrated is a free air under the right hemidiaphragm consistent with pneumoperitoneum.  Overall stable appearance since previous study.  IMPRESSION: Right central venous catheter and three left chest tubes remain unchanged in position.  Pleural fluid and basilar atelectasis on the left again demonstrated.  Left subcutaneous emphysema. Pneumoperitoneum.  No change since prior study.  Original Report Authenticated By: Marlon Pel, M.D.   Dg Chest Portable 1 View  05/26/2011  **ADDENDUM** CREATED: 05/26/2011 11:40:09  Per RN Dan Humphreys, presence of pneumoperitoneum was known/expected by Dr. Edwyna Shell.  **END ADDENDUM** SIGNED BY: Harley Hallmark, M.D.    05/26/2011  *RADIOLOGY REPORT*  Clinical Data: 36 year old female status post left VATS for empyema.  Postop, left chest tubes in place.  PORTABLE CHEST - 1 VIEW  Comparison: Preoperative radiographs and CT 05/25/2011  and earlier.  Findings: AP portable semi upright view at 1125 hours.  Right IJ central line in place, tip at the level of the carina.  Three left side chest tube in place.  Mild left chest wall subcutaneous gas and skin staples.  No pneumothorax identified.   Residual opacity at the left lung base, but some improved ventilation.  Pneumoperitoneum evident subjacent to the right hemidiaphragm.  No right pneumothorax.  Minor right lung base atelectasis.   Stable cardiac size and mediastinal contours.  Visualized tracheal air column is within normal limits.  IMPRESSION: 1.  Pneumoperitoneum which seems unexpected in this clinical setting. Critical Value/emergent results were called by telephone at the time of interpretation on 05/26/2011  at 1135 hours  to  RN Lester Kinsman in the PACU, who verbally acknowledged these results. 2.  Status post left VATS with three chest tubes in place and no pneumothorax identified. 3.  Right IJ central line in place, tip at the SVC level.  Original Report Authenticated By: Harley Hallmark, M.D.     Recent Results (from the past 720 hour(s))  SURGICAL PCR SCREEN     Status: Abnormal   Collection Time   05/25/11  3:14 PM      Component Value Range Status Comment   MRSA, PCR POSITIVE (*) NEGATIVE  Final    Staphylococcus aureus POSITIVE (*) NEGATIVE  Final   TISSUE CULTURE     Status: Normal (Preliminary result)   Collection Time   05/26/11 11:22 AM      Component Value Range Status Comment   Specimen Description TISSUE   Final    Special Requests PT ON VANC,ZOSYN LEFT PLEURAL PEEL   Final    Gram Stain PENDING   Incomplete    Culture     Final    Value: MODERATE STAPHYLOCOCCUS AUREUS     Note: RIFAMPIN AND GENTAMICIN SHOULD NOT BE USED AS SINGLE DRUGS FOR TREATMENT OF STAPH INFECTIONS.   Report Status PENDING   Incomplete   BODY FLUID CULTURE     Status: Normal (Preliminary result)   Collection Time   05/26/11 11:25 AM      Component Value Range Status Comment   Specimen Description PLEURAL FLUID LEFT   Final    Special Requests PT ON VANC,ZOSYN   Final    Gram Stain     Final    Value: MODERATE WBC PRESENT, PREDOMINANTLY PMN     FEW GRAM POSITIVE COCCI IN CLUSTERS   Culture     Final    Value: MODERATE  STAPHYLOCOCCUS AUREUS     Note: RIFAMPIN AND GENTAMICIN SHOULD NOT BE USED AS SINGLE DRUGS FOR TREATMENT OF STAPH INFECTIONS.   Report Status PENDING   Incomplete   ANAEROBIC CULTURE     Status: Normal (Preliminary result)   Collection Time   05/26/11 11:25 AM      Component Value Range Status Comment   Specimen Description PLEURAL FLUID LEFT   Final    Special Requests PT ON VANC,ZOSYN   Final    Gram Stain PENDING   Incomplete    Culture     Final    Value: NO ANAEROBES ISOLATED; CULTURE IN PROGRESS FOR 5 DAYS   Report Status PENDING   Incomplete      Impression/Recommendation  36 year old with ulcerative colitis sp prednisone mesalamine who failed avelox for pneumonia and found to have empyema with Staphylococcus Aureus  1) Staphylococcus Aureus PNA and Empyema: IF no blood  cultures were done at PCP will be difficult to exclude bacteremia.   I think she should be treated with aggressive course of parenteral antibiotics  I will change her to ancef and vancomycin pending Sensi data  I will place PICC line and plan on treating her for a month at minimum with IV antibiotics  I will also get a 2 d echocardiogram (though TEE would be more sensitive at looking for vegetations)  2) Screening: Check hiv     Thank you so much for this interesting consult,   Acey Lav 05/28/2011, 9:22 AM   [phone] (pager) [phone] (office)

## 2011-05-28 NOTE — Progress Notes (Signed)
ANTIBIOTIC CONSULT NOTE - FOLLOW UP  Pharmacy Consult for Vancomycin Indication: PNA and Empyema  No Known Allergies  Patient Measurements: Height: 5\' 3"  (160 cm) Weight: 169 lb 1.5 oz (76.7 kg) IBW/kg (Calculated) : 52.4   Vital Signs: Temp: 97.6 F (36.4 C) (04/22 0729) Temp src: Oral (04/22 0729) BP: 131/90 mmHg (04/22 0900) Pulse Rate: 103  (04/22 0900) Intake/Output from previous day: 04/21 0701 - 04/22 0700 In: 4100.9 [P.O.:900; I.V.:2613.4; IV Piggyback:587.5] Out: 2700 [Urine:2350; Chest Tube:350] Intake/Output from this shift: Total I/O In: 235 [I.V.:22.5; IV Piggyback:212.5] Out: 175 [Urine:125; Chest Tube:50]  Labs:  Basename 05/28/11 0500 05/27/11 0430 05/25/11 1539 05/25/11 1400  WBC 19.9* 19.9* -- 22.3*  HGB 8.0* 8.5* -- 11.6*  PLT 618* 608* -- 890*  LABCREA -- -- -- --  CREATININE 0.53 0.61 0.60 --   Estimated Creatinine Clearance: 93.5 ml/min (by C-G formula based on Cr of 0.53).  Basename 05/28/11 0830  VANCOTROUGH 7.9*  VANCOPEAK --  Drue Dun --  GENTTROUGH --  GENTPEAK --  GENTRANDOM --  TOBRATROUGH --  TOBRAPEAK --  TOBRARND --  AMIKACINPEAK --  AMIKACINTROU --  AMIKACIN --     Microbiology: Recent Results (from the past 720 hour(s))  SURGICAL PCR SCREEN     Status: Abnormal   Collection Time   05/25/11  3:14 PM      Component Value Range Status Comment   MRSA, PCR POSITIVE (*) NEGATIVE  Final    Staphylococcus aureus POSITIVE (*) NEGATIVE  Final   TISSUE CULTURE     Status: Normal (Preliminary result)   Collection Time   05/26/11 11:22 AM      Component Value Range Status Comment   Specimen Description TISSUE   Final    Special Requests PT ON VANC,ZOSYN LEFT PLEURAL PEEL   Final    Gram Stain PENDING   Incomplete    Culture     Final    Value: MODERATE STAPHYLOCOCCUS AUREUS     Note: RIFAMPIN AND GENTAMICIN SHOULD NOT BE USED AS SINGLE DRUGS FOR TREATMENT OF STAPH INFECTIONS.   Report Status PENDING   Incomplete   BODY  FLUID CULTURE     Status: Normal   Collection Time   05/26/11 11:25 AM      Component Value Range Status Comment   Specimen Description PLEURAL FLUID LEFT   Final    Special Requests PT ON VANC,ZOSYN   Final    Gram Stain     Final    Value: MODERATE WBC PRESENT, PREDOMINANTLY PMN     FEW GRAM POSITIVE COCCI IN CLUSTERS   Culture     Final    Value: MODERATE METHICILLIN RESISTANT STAPHYLOCOCCUS AUREUS     Note: RIFAMPIN AND GENTAMICIN SHOULD NOT BE USED AS SINGLE DRUGS FOR TREATMENT OF STAPH INFECTIONS. This organism DOES NOT demonstrate inducible Clindamycin resistance in vitro. CRITICAL RESULT CALLED TO, READ BACK BY AND VERIFIED WITH: CHRISTINE HINSHAW      @ 0945 05/28/11 BY KRAWS   Report Status 05/28/2011 FINAL   Final    Organism ID, Bacteria METHICILLIN RESISTANT STAPHYLOCOCCUS AUREUS   Final   ANAEROBIC CULTURE     Status: Normal (Preliminary result)   Collection Time   05/26/11 11:25 AM      Component Value Range Status Comment   Specimen Description PLEURAL FLUID LEFT   Final    Special Requests PT ON VANC,ZOSYN   Final    Gram Stain PENDING   Incomplete  Culture     Final    Value: NO ANAEROBES ISOLATED; CULTURE IN PROGRESS FOR 5 DAYS   Report Status PENDING   Incomplete     Anti-infectives     Start     Dose/Rate Route Frequency Ordered Stop   05/28/11 0945   ceFAZolin (ANCEF) IVPB 1 g/50 mL premix  Status:  Discontinued        1 g 100 mL/hr over 30 Minutes Intravenous 3 times per day 05/28/11 0931 05/28/11 0954   05/27/11 0000   vancomycin (VANCOCIN) 750 mg in sodium chloride 0.9 % 150 mL IVPB        750 mg 150 mL/hr over 60 Minutes Intravenous Every 8 hours 05/26/11 1537     05/26/11 1600   vancomycin (VANCOCIN) IVPB 1000 mg/200 mL premix        1,000 mg 200 mL/hr over 60 Minutes Intravenous Every 12 hours 05/26/11 1524 05/26/11 1754   05/25/11 1600   vancomycin (VANCOCIN) 750 mg in sodium chloride 0.9 % 150 mL IVPB  Status:  Discontinued        750 mg 150  mL/hr over 60 Minutes Intravenous Every 8 hours 05/25/11 1421 05/26/11 1537   05/25/11 1500   piperacillin-tazobactam (ZOSYN) IVPB 3.375 g  Status:  Discontinued        3.375 g 12.5 mL/hr over 240 Minutes Intravenous 3 times per day 05/25/11 1350 05/28/11 0931          Assessment: 36yof on Vancomycin Day 3 for Staph aureus PNA and empyema. ID has been consulted and is continuing Vancomycin with the addition of Ancef until sensitivities are reported. Pt had Tmax 100.8 and WBC are stable at 19.9. Vancomycin trough drawn this AM was subtherapeutic at 7.9. Patient has been receiving 750mg  Q8h. SCr and UOP have remained stable.   Goal of Therapy:  Vancomycin trough level 15-20 mcg/ml  Plan:  1. Increase Vancomycin to 1250mg  IV q8h - next dose @ 1200 2. Continue to monitor renal function, UOP, cultures/sensitivities and ID plans 3. Check follow-up trough in 2-3 days if Vancomycin continued  Cleon Dew [phone] 05/28/2011,10:01 AM

## 2011-05-28 NOTE — Progress Notes (Signed)
UR Completed.  Taylor [name] [name] [phone] 05/28/2011  

## 2011-05-28 NOTE — Progress Notes (Signed)
   CARE MANAGEMENT NOTE 05/28/2011  Patient:  Taylor [name], Taylor [name]   Account Number:  [id number]  Date Initiated:  05/28/2011  Documentation initiated by:  Shahana Capes  Subjective/Objective Assessment:   PT S/P LT VATS, THORACOTOMY ON 05/25/11.  PTA, PT INDEPENDENT, LIVES WITH SPOUSE.     Action/Plan:   SPOUSE TO PROVIDE CARE AT DISCHARGE; WILL FOLLOW FOR HOME NEEDS AS PT PROGRESSES.   Anticipated DC Date:  05/31/2011   Anticipated DC Plan:  HOME W HOME HEALTH SERVICES      DC Planning Services  CM consult      Choice offered to / List presented to:             Status of service:  In process, will continue to follow Medicare Important Message given?   (If response is "NO", the following Medicare IM given date fields will be blank) Date Medicare IM given:   Date Additional Medicare IM given:    Discharge Disposition:    Per UR Regulation:    If discussed at Long Length of Stay Meetings, dates discussed:    Comments:    Jerrell Belfast, RN, BSN Phone #[phone]

## 2011-05-28 NOTE — Progress Notes (Signed)
Right IJ access removed. Sterile technique used, removal with pt. Baring down, vasiline gauze applied and pressure held. Pt. Tolerated well. Will continue to monitor.

## 2011-05-29 ENCOUNTER — Inpatient Hospital Stay (HOSPITAL_COMMUNITY): Payer: Managed Care, Other (non HMO)

## 2011-05-29 LAB — TYPE AND SCREEN
ABO/RH(D): O POS
Unit division: 0

## 2011-05-29 LAB — BASIC METABOLIC PANEL
BUN: 3 mg/dL — ABNORMAL LOW (ref 6–23)
Chloride: 101 mEq/L (ref 96–112)
GFR calc Af Amer: >90 mL/min (ref >90)
GFR calc non Af Amer: >90 mL/min (ref >90)
Potassium: 3.8 mEq/L (ref 3.5–5.1)
Sodium: 137 mEq/L (ref 135–145)

## 2011-05-29 LAB — CBC
MCHC: 32.2 g/dL (ref 30.0–36.0)
Platelets: 619 10*3/uL — ABNORMAL HIGH (ref 150–400)
RDW: 13.4 % (ref 11.5–15.5)
WBC: 18.2 10*3/uL — ABNORMAL HIGH (ref 4.0–10.5)

## 2011-05-29 LAB — GLUCOSE, CAPILLARY
Glucose-Capillary: 85 mg/dL (ref 70–99)
Glucose-Capillary: 87 mg/dL (ref 70–99)

## 2011-05-29 NOTE — Progress Notes (Signed)
Subjective: Pt grew mrsa from cultures, 2 d echo showed pericardial effusion   Antibiotics:  Anti-infectives     Start     Dose/Rate Route Frequency Ordered Stop   05/28/11 1400   vancomycin (VANCOCIN) 1,250 mg in sodium chloride 0.9 % 250 mL IVPB        1,250 mg 166.7 mL/hr over 90 Minutes Intravenous Every 8 hours 05/28/11 1011     05/28/11 0945   ceFAZolin (ANCEF) IVPB 1 g/50 mL premix  Status:  Discontinued        1 g 100 mL/hr over 30 Minutes Intravenous 3 times per day 05/28/11 0931 05/28/11 0954   05/27/11 0000   vancomycin (VANCOCIN) 750 mg in sodium chloride 0.9 % 150 mL IVPB  Status:  Discontinued        750 mg 150 mL/hr over 60 Minutes Intravenous Every 8 hours 05/26/11 1537 05/28/11 1011   05/26/11 1600   vancomycin (VANCOCIN) IVPB 1000 mg/200 mL premix        1,000 mg 200 mL/hr over 60 Minutes Intravenous Every 12 hours 05/26/11 1524 05/26/11 1754   05/25/11 1600   vancomycin (VANCOCIN) 750 mg in sodium chloride 0.9 % 150 mL IVPB  Status:  Discontinued        750 mg 150 mL/hr over 60 Minutes Intravenous Every 8 hours 05/25/11 1421 05/26/11 1537   05/25/11 1500   piperacillin-tazobactam (ZOSYN) IVPB 3.375 g  Status:  Discontinued        3.375 g 12.5 mL/hr over 240 Minutes Intravenous 3 times per day 05/25/11 1350 05/28/11 0931          Medications: Scheduled Meds:   . bisacodyl  10 mg Oral Daily  . Chlorhexidine Gluconate Cloth  6 each Topical Daily  . fentaNYL   Intravenous Q4H  . FLUoxetine  40 mg Oral Daily  . insulin aspart  0-24 Units Subcutaneous Q6H  . iron polysaccharides  150 mg Oral Daily  . mesalamine  2.4 g Oral Q breakfast  . mupirocin ointment  1 application Nasal BID  . sodium chloride  10-40 mL Intracatheter Q12H  . vancomycin  1,250 mg Intravenous Q8H  . DISCONTD:  ceFAZolin (ANCEF) IV  1 g Intravenous Q8H  . DISCONTD: fentaNYL   Intravenous Q4H  . DISCONTD: piperacillin-tazobactam (ZOSYN)  IV  3.375 g Intravenous Q8H  . DISCONTD:  vancomycin  750 mg Intravenous Q8H   Continuous Infusions:   . 0.9 % NaCl with KCl 20 mEq / L 20 mL/hr at 05/29/11 0848  . dextrose 5 % and 0.45% NaCl Stopped (05/28/11 1900)   PRN Meds:.acetaminophen, acetaminophen, diphenhydrAMINE, diphenhydrAMINE, fentaNYL, HYDROcodone-acetaminophen, naloxone, ondansetron (ZOFRAN) IV, ondansetron (ZOFRAN) IV, ondansetron, oxyCODONE-acetaminophen, oxyCODONE-acetaminophen, potassium chloride, senna-docusate, sodium chloride, sodium chloride, traMADol, zolpidem   Objective: Weight change: 0 lb (0 kg)  Intake/Output Summary (Last 24 hours) at 05/29/11 0916 Last data filed at 05/29/11 0800  Gross per 24 hour  Intake 2211.83 ml  Output   1710 ml  Net 501.83 ml   Blood pressure 129/86, pulse 93, temperature 97 F (36.1 C), temperature source Oral, resp. rate 18, height 5\' 3"  (1.6 m), weight 169 lb 1.5 oz (76.7 kg), last menstrual period 05/18/2011, SpO2 94.00%. Temp:  [97 F (36.1 C)-98.8 F (37.1 C)] 97 F (36.1 C) (04/23 0800) Pulse Rate:  [93-116] 93  (04/23 0700) Resp:  [14-27] 18  (04/23 0800) BP: (81-143)/(62-109) 129/86 mmHg (04/23 0800) SpO2:  [91 %-99 %] 94 % (04/23 0800) Weight:  [409  lb 1.5 oz (76.7 kg)] 169 lb 1.5 oz (76.7 kg) (04/23 0500)  Physical Exam: General: Alert and awake, oriented x3, not in any acute distress.  HEENT: anicteric sclera, pupils reactive to light and accommodation, EOMI, oropharynx clear and without exudate  CVS regular rate, normal r, no murmur rubs or gallops  Chest: Diminished breath sounds at the bases , chest tubes x 3 on left side and blood tinged fluid in bag  Abdomen: soft nontender, nondistended, normal bowel sounds,  Extremities: no clubbing or edema noted bilaterally  Skin: no rashes  Neuro: nonfocal, strength and sensation intact   Lab Results:  Basename 05/29/11 0500 05/28/11 0500  WBC 18.2* 19.9*  HGB 7.8* 8.0*  HCT 24.2* 25.1*  PLT 619* 618*    BMET  Basename 05/29/11 0500 05/28/11  0500  NA 137 135  K 3.8 3.7  CL 101 99  CO2 28 27  GLUCOSE 87 81  BUN 3* 4*  CREATININE 0.38* 0.53  CALCIUM 8.4 8.5    Micro Results: Recent Results (from the past 240 hour(s))  SURGICAL PCR SCREEN     Status: Abnormal   Collection Time   05/25/11  3:14 PM      Component Value Range Status Comment   MRSA, PCR POSITIVE (*) NEGATIVE  Final    Staphylococcus aureus POSITIVE (*) NEGATIVE  Final   TISSUE CULTURE     Status: Normal   Collection Time   05/26/11 11:22 AM      Component Value Range Status Comment   Specimen Description TISSUE   Final    Special Requests PT ON VANC,ZOSYN LEFT PLEURAL PEEL   Final    Gram Stain     Final    Value: MODERATE WBC PRESENT, PREDOMINANTLY PMN     FEW GRAM POSITIVE COCCI     IN CLUSTERS   Culture     Final    Value: MODERATE STAPHYLOCOCCUS AUREUS     Note: RIFAMPIN AND GENTAMICIN SHOULD NOT BE USED AS SINGLE DRUGS FOR TREATMENT OF STAPH INFECTIONS. This organism DOES NOT demonstrate inducible Clindamycin resistance in vitro.   Report Status 05/28/2011 FINAL   Final    Organism ID, Bacteria STAPHYLOCOCCUS AUREUS   Final   BODY FLUID CULTURE     Status: Normal   Collection Time   05/26/11 11:25 AM      Component Value Range Status Comment   Specimen Description PLEURAL FLUID LEFT   Final    Special Requests PT ON VANC,ZOSYN   Final    Gram Stain     Final    Value: MODERATE WBC PRESENT, PREDOMINANTLY PMN     FEW GRAM POSITIVE COCCI IN CLUSTERS   Culture     Final    Value: MODERATE METHICILLIN RESISTANT STAPHYLOCOCCUS AUREUS     Note: RIFAMPIN AND GENTAMICIN SHOULD NOT BE USED AS SINGLE DRUGS FOR TREATMENT OF STAPH INFECTIONS. This organism DOES NOT demonstrate inducible Clindamycin resistance in vitro. CRITICAL RESULT CALLED TO, READ BACK BY AND VERIFIED WITH: CHRISTINE HINSHAW      @ 0945 05/28/11 BY KRAWS   Report Status 05/28/2011 FINAL   Final    Organism ID, Bacteria METHICILLIN RESISTANT STAPHYLOCOCCUS AUREUS   Final   ANAEROBIC  CULTURE     Status: Normal (Preliminary result)   Collection Time   05/26/11 11:25 AM      Component Value Range Status Comment   Specimen Description PLEURAL FLUID LEFT   Final    Special Requests  PT ON VANC,ZOSYN   Final    Gram Stain PENDING   Incomplete    Culture     Final    Value: NO ANAEROBES ISOLATED; CULTURE IN PROGRESS FOR 5 DAYS   Report Status PENDING   Incomplete     Studies/Results: Dg Chest Port 1 View  05/29/2011  *RADIOLOGY REPORT*  Clinical Data: Follow up empyema.  PORTABLE CHEST - 1 VIEW  Comparison: 05/28/2011  Findings: There are three left chest drains in stable position. There is no significant pneumothorax.  Stable densities at the left lung base associated with volume loss.  There is a right arm PICC line with the tip in the SVC.  Right jugular central line has been removed.  The right lung is clear.  Stable enlargement of the cardiac silhouette.  IMPRESSION: Stable position of the left chest tubes without a large pneumothorax.  Stable volume loss in the left hemithorax with basilar densities at the left base.  Original Report Authenticated By: Richarda Overlie, M.D.   Dg Chest Port 1 View  05/28/2011  *RADIOLOGY REPORT*  Clinical Data: Empyema.  PORTABLE CHEST - 1 VIEW  Comparison: 04/21 and 05/26/2011  Findings: Three chest tubes remain in place.  Central venous catheter tip is in the superior vena cava.  No pneumothorax.  Slight increased density at the left lung base since the prior study.  The lower chest tube may be kinked at the side hole.  Right lung is clear.  Air under the right hemidiaphragm has almost resolved.  IMPRESSION: Slight increased density at the left lung base.  Possible kink in the inferior chest tube.  Original Report Authenticated By: Gwynn Burly, M.D.      Assessment/Plan: Taylor [name] is a 36 y.o. female with UC on prednisone, mesalamine with   MRSA PNA and empyema (possibly with undocomented bacteremia). She has pericardial effusion on 2 d  echo  1) Percardial effusion: Cardiologist felt this was not terribly consistent with infected pericardial effusion based on imaging characteristics but given her KNOWN MRSA empyema, pneumonia, I WOULD worry about this effusion --could this be safely drained and sent for culture? --would a TEE better evaluate this (it would also get better picture of the valves though diagnosign endocarditis in this pt would only significant change management if she needed CVTS surgery for valve. (It might also push [place] to go for 6 rather than 4 weeks of IV abx)  2) MRSA Empyema: Plan on rx with protracted course with IV vancomycin likely 4 weeks minimum likely followed by oral abx  3) Screening: HIV negative  LOS: 4 days   Acey Lav 05/29/2011, 9:16 AM

## 2011-05-29 NOTE — Progress Notes (Signed)
                                              3 Days Post-Op Procedure(s) (LRB): VIDEO ASSISTED THORACOSCOPY (VATS)/EMPYEMA (Left) THORACOTOMY MAJOR (Left) Subjective: The patient is stable. No airleak. Chest x-ray slightly improved aeration. We'll transfer to 3300. Appreciate  infectious disease consult. DC suction. DC CVP line  Objective: Vital signs in last 24 hours: Temp:  [97.6 F (36.4 C)-98.8 F (37.1 C)] 98.6 F (37 C) (04/23 0004) Pulse Rate:  [93-116] 93  (04/23 0700) Cardiac Rhythm:  [-] Normal sinus rhythm (04/23 0200) Resp:  [14-27] 17  (04/23 0700) BP: (81-143)/(62-109) 123/84 mmHg (04/23 0700) SpO2:  [91 %-99 %] 97 % (04/23 0700) Weight:  [76.7 kg (169 lb 1.5 oz)] 76.7 kg (169 lb 1.5 oz) (04/23 0500)  Hemodynamic parameters for last 24 hours:    Intake/Output from previous day: 04/22 0701 - 04/23 0700 In: 2319.3 [P.O.:200; I.V.:1044.3; IV Piggyback:1075] Out: 2045 [Urine:1685; Chest Tube:360] Intake/Output this shift:    General appearance: alert Lungs: clear to auscultation bilaterally  Lab Results:  Basename 05/29/11 0500 05/28/11 0500  WBC 18.2* 19.9*  HGB 7.8* 8.0*  HCT 24.2* 25.1*  PLT 619* 618*   BMET:  Basename 05/29/11 0500 05/28/11 0500  NA 137 135  K 3.8 3.7  CL 101 99  CO2 28 27  GLUCOSE 87 81  BUN 3* 4*  CREATININE 0.38* 0.53  CALCIUM 8.4 8.5    PT/INR: No results found for this basename: LABPROT,INR in the last 72 hours ABG    Component Value Date/Time   PHART 7.353 05/27/2011 0449   HCO3 27.3* 05/27/2011 0449   TCO2 29 05/27/2011 0449   O2SAT 96.0 05/27/2011 0449   CBG (last 3)   Basename 05/28/11 2206 05/28/11 1515 05/28/11 1147  GLUCAP 113* 86 108*    Assessment/Plan: S/P Procedure(s) (LRB): VIDEO ASSISTED THORACOSCOPY (VATS)/EMPYEMA (Left) THORACOTOMY MAJOR (Left) Transfer to 3300 discontinue suction and discontinue CVP line   LOS: 4 days    Cathan Gearin PATRICK 05/29/2011

## 2011-05-30 ENCOUNTER — Inpatient Hospital Stay (HOSPITAL_COMMUNITY): Payer: Managed Care, Other (non HMO)

## 2011-05-30 LAB — CBC
HCT: 24.5 % — ABNORMAL LOW (ref 36.0–46.0)
MCH: 25.2 pg — ABNORMAL LOW (ref 26.0–34.0)
MCHC: 31.4 g/dL (ref 30.0–36.0)
RDW: 13.5 % (ref 11.5–15.5)

## 2011-05-30 LAB — GLUCOSE, CAPILLARY
Glucose-Capillary: 89 mg/dL (ref 70–99)
Glucose-Capillary: 90 mg/dL (ref 70–99)
Glucose-Capillary: 102 mg/dL — ABNORMAL HIGH (ref 70–99)

## 2011-05-30 NOTE — Progress Notes (Signed)
Subjective: Pt grew mrsa from cultures, 2 d echo showed pericardial effusion   Antibiotics:  Anti-infectives     Start     Dose/Rate Route Frequency Ordered Stop   05/28/11 1400   vancomycin (VANCOCIN) 1,250 mg in sodium chloride 0.9 % 250 mL IVPB        1,250 mg 166.7 mL/hr over 90 Minutes Intravenous Every 8 hours 05/28/11 1011     05/28/11 0945   ceFAZolin (ANCEF) IVPB 1 g/50 mL premix  Status:  Discontinued        1 g 100 mL/hr over 30 Minutes Intravenous 3 times per day 05/28/11 0931 05/28/11 0954   05/27/11 0000   vancomycin (VANCOCIN) 750 mg in sodium chloride 0.9 % 150 mL IVPB  Status:  Discontinued        750 mg 150 mL/hr over 60 Minutes Intravenous Every 8 hours 05/26/11 1537 05/28/11 1011   05/26/11 1600   vancomycin (VANCOCIN) IVPB 1000 mg/200 mL premix        1,000 mg 200 mL/hr over 60 Minutes Intravenous Every 12 hours 05/26/11 1524 05/26/11 1754   05/25/11 1600   vancomycin (VANCOCIN) 750 mg in sodium chloride 0.9 % 150 mL IVPB  Status:  Discontinued        750 mg 150 mL/hr over 60 Minutes Intravenous Every 8 hours 05/25/11 1421 05/26/11 1537   05/25/11 1500   piperacillin-tazobactam (ZOSYN) IVPB 3.375 g  Status:  Discontinued        3.375 g 12.5 mL/hr over 240 Minutes Intravenous 3 times per day 05/25/11 1350 05/28/11 0931          Medications: Scheduled Meds:    . bisacodyl  10 mg Oral Daily  . Chlorhexidine Gluconate Cloth  6 each Topical Daily  . fentaNYL   Intravenous Q4H  . FLUoxetine  40 mg Oral Daily  . iron polysaccharides  150 mg Oral Daily  . mesalamine  2.4 g Oral Q breakfast  . mupirocin ointment  1 application Nasal BID  . sodium chloride  10-40 mL Intracatheter Q12H  . vancomycin  1,250 mg Intravenous Q8H  . DISCONTD: insulin aspart  0-24 Units Subcutaneous Q6H   Continuous Infusions:    . 0.9 % NaCl with KCl 20 mEq / L 20 mL/hr at 05/30/11 1700  . DISCONTD: dextrose 5 % and 0.45% NaCl Stopped (05/28/11 1900)   PRN  Meds:.acetaminophen, acetaminophen, diphenhydrAMINE, diphenhydrAMINE, fentaNYL, HYDROcodone-acetaminophen, naloxone, ondansetron (ZOFRAN) IV, ondansetron (ZOFRAN) IV, ondansetron, oxyCODONE-acetaminophen, oxyCODONE-acetaminophen, potassium chloride, senna-docusate, sodium chloride, sodium chloride, traMADol, zolpidem   Objective: Weight change: 14.1 oz (0.4 kg)  Intake/Output Summary (Last 24 hours) at 05/30/11 2036 Last data filed at 05/30/11 1700  Gross per 24 hour  Intake 1757.4 ml  Output   1745 ml  Net   12.4 ml   Blood pressure 142/88, pulse 98, temperature 97.2 F (36.2 C), temperature source Oral, resp. rate 26, height 5\' 3"  (1.6 m), weight 169 lb 15.6 oz (77.1 kg), last menstrual period 05/18/2011, SpO2 97.00%. Temp:  [97.2 F (36.2 C)-98.7 F (37.1 C)] 97.2 F (36.2 C) (04/24 1600) Pulse Rate:  [97-104] 98  (04/24 1600) Resp:  [17-26] 26  (04/24 1600) BP: (131-142)/(80-97) 142/88 mmHg (04/24 1600) SpO2:  [95 %-100 %] 97 % (04/24 1600) Weight:  [169 lb 15.6 oz (77.1 kg)] 169 lb 15.6 oz (77.1 kg) (04/24 0300)  Physical Exam: General: Alert and awake, oriented x3, not in any acute distress.  HEENT: anicteric sclera, pupils reactive to light  and accommodation, EOMI, oropharynx clear and without exudate  CVS regular rate, normal r, no murmur rubs or gallops  Chest: Diminished breath sounds at the bases , chest tubes x 2 on left side and blood tinged fluid in bag  Abdomen: soft nontender, nondistended, normal bowel sounds,  Extremities: no clubbing or edema noted bilaterally  Skin: no rashes  Neuro: nonfocal, strength and sensation intact   Lab Results:  Basename 05/30/11 0410 05/29/11 0500  WBC 17.2* 18.2*  HGB 7.7* 7.8*  HCT 24.5* 24.2*  PLT 639* 619*    BMET  Basename 05/29/11 0500 05/28/11 0500  NA 137 135  K 3.8 3.7  CL 101 99  CO2 28 27  GLUCOSE 87 81  BUN 3* 4*  CREATININE 0.38* 0.53  CALCIUM 8.4 8.5    Micro Results: Recent Results (from the past  240 hour(s))  SURGICAL PCR SCREEN     Status: Abnormal   Collection Time   05/25/11  3:14 PM      Component Value Range Status Comment   MRSA, PCR POSITIVE (*) NEGATIVE  Final    Staphylococcus aureus POSITIVE (*) NEGATIVE  Final   TISSUE CULTURE     Status: Normal   Collection Time   05/26/11 11:22 AM      Component Value Range Status Comment   Specimen Description TISSUE   Final    Special Requests PT ON VANC,ZOSYN LEFT PLEURAL PEEL   Final    Gram Stain     Final    Value: MODERATE WBC PRESENT, PREDOMINANTLY PMN     FEW GRAM POSITIVE COCCI     IN CLUSTERS   Culture     Final    Value: MODERATE STAPHYLOCOCCUS AUREUS     Note: RIFAMPIN AND GENTAMICIN SHOULD NOT BE USED AS SINGLE DRUGS FOR TREATMENT OF STAPH INFECTIONS. This organism DOES NOT demonstrate inducible Clindamycin resistance in vitro.   Report Status 05/28/2011 FINAL   Final    Organism ID, Bacteria STAPHYLOCOCCUS AUREUS   Final   BODY FLUID CULTURE     Status: Normal   Collection Time   05/26/11 11:25 AM      Component Value Range Status Comment   Specimen Description PLEURAL FLUID LEFT   Final    Special Requests PT ON VANC,ZOSYN   Final    Gram Stain     Final    Value: MODERATE WBC PRESENT, PREDOMINANTLY PMN     FEW GRAM POSITIVE COCCI IN CLUSTERS   Culture     Final    Value: MODERATE METHICILLIN RESISTANT STAPHYLOCOCCUS AUREUS     Note: RIFAMPIN AND GENTAMICIN SHOULD NOT BE USED AS SINGLE DRUGS FOR TREATMENT OF STAPH INFECTIONS. This organism DOES NOT demonstrate inducible Clindamycin resistance in vitro. CRITICAL RESULT CALLED TO, READ BACK BY AND VERIFIED WITH: CHRISTINE HINSHAW      @ 0945 05/28/11 BY KRAWS   Report Status 05/28/2011 FINAL   Final    Organism ID, Bacteria METHICILLIN RESISTANT STAPHYLOCOCCUS AUREUS   Final   ANAEROBIC CULTURE     Status: Normal (Preliminary result)   Collection Time   05/26/11 11:25 AM      Component Value Range Status Comment   Specimen Description PLEURAL FLUID LEFT   Final     Special Requests PT ON VANC,ZOSYN   Final    Gram Stain PENDING   Incomplete    Culture     Final    Value: NO ANAEROBES ISOLATED; CULTURE IN PROGRESS FOR  5 DAYS   Report Status PENDING   Incomplete     Studies/Results: Dg Chest Port 1 View  05/30/2011  *RADIOLOGY REPORT*  Clinical Data: Empyema.  PORTABLE CHEST - 1 VIEW  Comparison: 05/29/2011  Findings: PICC tip is in good position.  Chest tubes are unchanged. Persistent consolidation with air bronchograms at the left lung base.  No pneumothorax.  Overall density at the left base has diminished probably indicating reduction in the residual empyema.  IMPRESSION: Slight decrease in density at the left lung base.  Consolidation in the left lower lobe persists.  Original Report Authenticated By: Gwynn Burly, M.D.   Dg Chest Port 1 View  05/29/2011  *RADIOLOGY REPORT*  Clinical Data: Follow up empyema.  PORTABLE CHEST - 1 VIEW  Comparison: 05/28/2011  Findings: There are three left chest drains in stable position. There is no significant pneumothorax.  Stable densities at the left lung base associated with volume loss.  There is a right arm PICC line with the tip in the SVC.  Right jugular central line has been removed.  The right lung is clear.  Stable enlargement of the cardiac silhouette.  IMPRESSION: Stable position of the left chest tubes without a large pneumothorax.  Stable volume loss in the left hemithorax with basilar densities at the left base.  Original Report Authenticated By: Richarda Overlie, M.D.      Assessment/Plan: Taylor [name] is a 36 y.o. female with UC on prednisone, mesalamine with   MRSA PNA and empyema (possibly with undocomented bacteremia). She has pericardial effusion on 2 d echo  1) Percardial effusion:  Discussed yesterday with Dr. Edwyna Shell and he will get followup echo in a few days x)  2) MRSA Empyema: Plan on rx with protracted course with IV vancomycin likely 4 weeks minimum likely followed by oral abx  Will  make sure pt has followup with me in [facility] in next 2 to 3 weeks   LOS: 5 days   Acey Lav 05/30/2011, 8:36 PM

## 2011-05-30 NOTE — Progress Notes (Signed)
                                              4 Days Post-Op Procedure(s) (LRB): VIDEO ASSISTED THORACOSCOPY (VATS)/EMPYEMA (Left) THORACOTOMY MAJOR (Left) Subjective: Patient is afebrile. White count 17,000. Patient had a pericardial effusion on initialCT scan as well as echo. At the time of surgery the pericardium appeared to be normal with slight inflammation. Will watch pericardial effusion I feel this is probably secondary to her lingular pneumonia. We'll repeat her echo in a few days. Will DC anterior chest tube. Will discontinue PCA tomorrow. I will to transfer to 3300 today. Chest x-ray shows improving aeration.  Objective: Vital signs in last 24 hours: Temp:  [97.1 F (36.2 C)-98.7 F (37.1 C)] 97.6 F (36.4 C) (04/24 0735) Pulse Rate:  [91-104] 98  (04/24 0800) Cardiac Rhythm:  [-] Normal sinus rhythm (04/24 0800) Resp:  [15-25] 23  (04/24 0800) BP: (124-137)/(74-95) 131/95 mmHg (04/24 0800) SpO2:  [93 %-100 %] 97 % (04/24 0800) Weight:  [77.1 kg (169 lb 15.6 oz)] 77.1 kg (169 lb 15.6 oz) (04/24 0300)  Hemodynamic parameters for last 24 hours:    Intake/Output from previous day: 04/23 0701 - 04/24 0700 In: 2487.5 [P.O.:1020; I.V.:717.5; IV Piggyback:750] Out: 1710 [Urine:1650; Chest Tube:60] Intake/Output this shift: Total I/O In: 286.9 [P.O.:240; I.V.:46.9] Out: -   General appearance: alert Heart: regular rate and rhythm, S1, S2 normal, no murmur, click, rub or gallop Lungs: clear to auscultation bilaterally  Lab Results:  Basename 05/30/11 0410 05/29/11 0500  WBC 17.2* 18.2*  HGB 7.7* 7.8*  HCT 24.5* 24.2*  PLT 639* 619*   BMET:  Basename 05/29/11 0500 05/28/11 0500  NA 137 135  K 3.8 3.7  CL 101 99  CO2 28 27  GLUCOSE 87 81  BUN 3* 4*  CREATININE 0.38* 0.53  CALCIUM 8.4 8.5    PT/INR: No results found for this basename: LABPROT,INR in the last 72 hours ABG    Component Value Date/Time   PHART 7.353 05/27/2011 0449   HCO3 27.3* 05/27/2011 0449    TCO2 29 05/27/2011 0449   O2SAT 96.0 05/27/2011 0449   CBG (last 3)   Basename 05/30/11 0548 05/29/11 2327 05/29/11 1759  GLUCAP 83 89 87    Assessment/Plan: S/P Procedure(s) (LRB): VIDEO ASSISTED THORACOSCOPY (VATS)/EMPYEMA (Left) THORACOTOMY MAJOR (Left) Discontinue anterior chest tube transferred to 3300   LOS: 5 days    Carlyann Placide PATRICK 05/30/2011

## 2011-05-30 NOTE — Progress Notes (Signed)
05/30/2011 1335 Pt arrived from 2300. VSS. 2 chest tubes to waterseal. No complaints of pain. Educated pt on safety. Bed is low and call bell with in reach. Will continue to monitor. Taylor [name]

## 2011-05-31 ENCOUNTER — Inpatient Hospital Stay (HOSPITAL_COMMUNITY): Payer: Managed Care, Other (non HMO)

## 2011-05-31 LAB — ANAEROBIC CULTURE

## 2011-05-31 LAB — GLUCOSE, CAPILLARY: Glucose-Capillary: 107 mg/dL — ABNORMAL HIGH (ref 70–99)

## 2011-05-31 LAB — VANCOMYCIN, TROUGH: Vancomycin Tr: 15.4 ug/mL (ref 10.0–20.0)

## 2011-05-31 MED ORDER — SODIUM CHLORIDE 0.9 % IJ SOLN
INTRAMUSCULAR | Status: AC
  Filled 2011-05-31: qty 10

## 2011-05-31 NOTE — Progress Notes (Signed)
Subjective: Feeling bettter   Antibiotics:  Anti-infectives     Start     Dose/Rate Route Frequency Ordered Stop   05/28/11 1400   vancomycin (VANCOCIN) 1,250 mg in sodium chloride 0.9 % 250 mL IVPB        1,250 mg 166.7 mL/hr over 90 Minutes Intravenous Every 8 hours 05/28/11 1011     05/28/11 0945   ceFAZolin (ANCEF) IVPB 1 g/50 mL premix  Status:  Discontinued        1 g 100 mL/hr over 30 Minutes Intravenous 3 times per day 05/28/11 0931 05/28/11 0954   05/27/11 0000   vancomycin (VANCOCIN) 750 mg in sodium chloride 0.9 % 150 mL IVPB  Status:  Discontinued        750 mg 150 mL/hr over 60 Minutes Intravenous Every 8 hours 05/26/11 1537 05/28/11 1011   05/26/11 1600   vancomycin (VANCOCIN) IVPB 1000 mg/200 mL premix        1,000 mg 200 mL/hr over 60 Minutes Intravenous Every 12 hours 05/26/11 1524 05/26/11 1754   05/25/11 1600   vancomycin (VANCOCIN) 750 mg in sodium chloride 0.9 % 150 mL IVPB  Status:  Discontinued        750 mg 150 mL/hr over 60 Minutes Intravenous Every 8 hours 05/25/11 1421 05/26/11 1537   05/25/11 1500   piperacillin-tazobactam (ZOSYN) IVPB 3.375 g  Status:  Discontinued        3.375 g 12.5 mL/hr over 240 Minutes Intravenous 3 times per day 05/25/11 1350 05/28/11 0931          Medications: Scheduled Meds:    . bisacodyl  10 mg Oral Daily  . Chlorhexidine Gluconate Cloth  6 each Topical Daily  . FLUoxetine  40 mg Oral Daily  . iron polysaccharides  150 mg Oral Daily  . mesalamine  2.4 g Oral Q breakfast  . mupirocin ointment  1 application Nasal BID  . sodium chloride  10-40 mL Intracatheter Q12H  . vancomycin  1,250 mg Intravenous Q8H  . DISCONTD: fentaNYL   Intravenous Q4H   Continuous Infusions:    . 0.9 % NaCl with KCl 20 mEq / L 20 mL/hr at 05/30/11 1700   PRN Meds:.acetaminophen, acetaminophen, fentaNYL, HYDROcodone-acetaminophen, ondansetron (ZOFRAN) IV, ondansetron, oxyCODONE-acetaminophen, oxyCODONE-acetaminophen, potassium  chloride, senna-docusate, sodium chloride, traMADol, zolpidem, DISCONTD: diphenhydrAMINE, DISCONTD: diphenhydrAMINE, DISCONTD: naloxone, DISCONTD: ondansetron (ZOFRAN) IV, DISCONTD: sodium chloride   Objective: Weight change:   Intake/Output Summary (Last 24 hours) at 05/31/11 1042 Last data filed at 05/31/11 0900  Gross per 24 hour  Intake 1094.5 ml  Output    790 ml  Net  304.5 ml   Blood pressure 139/89, pulse 117, temperature 98.1 F (36.7 C), temperature source Oral, resp. rate 22, height 5\' 3"  (1.6 m), weight 169 lb 15.6 oz (77.1 kg), last menstrual period 05/20/2011, SpO2 95.00%. Temp:  [97.2 F (36.2 C)-98.7 F (37.1 C)] 98.1 F (36.7 C) (04/25 0800) Pulse Rate:  [98-117] 117  (04/25 0345) Resp:  [21-26] 22  (04/25 0345) BP: (131-148)/(83-97) 139/89 mmHg (04/25 0800) SpO2:  [95 %-97 %] 95 % (04/25 0345)  Physical Exam: General: Alert and awake, oriented x3, not in any acute distress.  HEENT: anicteric sclera, pupils reactive to light and accommodation, EOMI, oropharynx clear and without exudate  CVS regular rate, normal r, no murmur rubs or gallops  Chest: Diminished breath sounds at the bases , chest tubes x 2 on left side and blood tinged fluid in bag  Abdomen: soft  nontender, nondistended, normal bowel sounds,  Extremities: no clubbing or edema noted bilaterally  Skin: no rashes  Neuro: nonfocal, strength and sensation intact   Lab Results:  Basename 05/30/11 0410 05/29/11 0500  WBC 17.2* 18.2*  HGB 7.7* 7.8*  HCT 24.5* 24.2*  PLT 639* 619*    BMET  Basename 05/29/11 0500  NA 137  K 3.8  CL 101  CO2 28  GLUCOSE 87  BUN 3*  CREATININE 0.38*  CALCIUM 8.4    Micro Results: Recent Results (from the past 240 hour(s))  SURGICAL PCR SCREEN     Status: Abnormal   Collection Time   05/25/11  3:14 PM      Component Value Range Status Comment   MRSA, PCR POSITIVE (*) NEGATIVE  Final    Staphylococcus aureus POSITIVE (*) NEGATIVE  Final   TISSUE CULTURE      Status: Normal   Collection Time   05/26/11 11:22 AM      Component Value Range Status Comment   Specimen Description TISSUE   Final    Special Requests PT ON VANC,ZOSYN LEFT PLEURAL PEEL   Final    Gram Stain     Final    Value: MODERATE WBC PRESENT, PREDOMINANTLY PMN     FEW GRAM POSITIVE COCCI     IN CLUSTERS   Culture     Final    Value: MODERATE STAPHYLOCOCCUS AUREUS     Note: RIFAMPIN AND GENTAMICIN SHOULD NOT BE USED AS SINGLE DRUGS FOR TREATMENT OF STAPH INFECTIONS. This organism DOES NOT demonstrate inducible Clindamycin resistance in vitro.   Report Status 05/28/2011 FINAL   Final    Organism ID, Bacteria STAPHYLOCOCCUS AUREUS   Final   BODY FLUID CULTURE     Status: Normal   Collection Time   05/26/11 11:25 AM      Component Value Range Status Comment   Specimen Description PLEURAL FLUID LEFT   Final    Special Requests PT ON VANC,ZOSYN   Final    Gram Stain     Final    Value: MODERATE WBC PRESENT, PREDOMINANTLY PMN     FEW GRAM POSITIVE COCCI IN CLUSTERS   Culture     Final    Value: MODERATE METHICILLIN RESISTANT STAPHYLOCOCCUS AUREUS     Note: RIFAMPIN AND GENTAMICIN SHOULD NOT BE USED AS SINGLE DRUGS FOR TREATMENT OF STAPH INFECTIONS. This organism DOES NOT demonstrate inducible Clindamycin resistance in vitro. CRITICAL RESULT CALLED TO, READ BACK BY AND VERIFIED WITH: CHRISTINE HINSHAW      @ 0945 05/28/11 BY KRAWS   Report Status 05/28/2011 FINAL   Final    Organism ID, Bacteria METHICILLIN RESISTANT STAPHYLOCOCCUS AUREUS   Final   ANAEROBIC CULTURE     Status: Normal (Preliminary result)   Collection Time   05/26/11 11:25 AM      Component Value Range Status Comment   Specimen Description PLEURAL FLUID LEFT   Final    Special Requests PT ON VANC,ZOSYN   Final    Gram Stain PENDING   Incomplete    Culture     Final    Value: NO ANAEROBES ISOLATED; CULTURE IN PROGRESS FOR 5 DAYS   Report Status PENDING   Incomplete     Studies/Results: Dg Chest Port 1  View  05/31/2011  *RADIOLOGY REPORT*  Clinical Data: Follow-up empyema.  PORTABLE CHEST - 1 VIEW  Comparison: 05/30/2011 and 05/29/2011.  Findings: 0625 hours.  Right arm PICC has been partially withdrawn into  the mid SVC.  One of the left sided chest tubes has been removed in the interval.  The other chest tubes are unchanged in position; the inferior tube is kinked at the side port.  Pleural parenchymal opacities inferiorly in the left hemithorax are unchanged.  The right lung is clear.  There is no pneumothorax. The heart size and mediastinal contours are stable.  IMPRESSION: Left chest tube removal and slight change in positioning of PICC line tip.  Otherwise stable postoperative chest.  Original Report Authenticated By: Gerrianne Scale, M.D.   Dg Chest Port 1 View  05/30/2011  *RADIOLOGY REPORT*  Clinical Data: Empyema.  PORTABLE CHEST - 1 VIEW  Comparison: 05/29/2011  Findings: PICC tip is in good position.  Chest tubes are unchanged. Persistent consolidation with air bronchograms at the left lung base.  No pneumothorax.  Overall density at the left base has diminished probably indicating reduction in the residual empyema.  IMPRESSION: Slight decrease in density at the left lung base.  Consolidation in the left lower lobe persists.  Original Report Authenticated By: Gwynn Burly, M.D.      Assessment/Plan: Taylor [name] is a 36 y.o. female with UC on prednisone, mesalamine with   MRSA PNA and empyema (possibly with undocomented bacteremia). She has pericardial effusion on 2 d echo  1) Percardial effusion:  Discussed with Dr. Edwyna Shell and he will get followup echo in a few days x)  2) MRSA Empyema: Plan on rx with protracted course with IV vancomycin likely 4 weeks minimum likely followed by oral abx  Will make sure pt has followup with me in [facility] in next 2 to 3 weeks   LOS: 6 days   Acey Lav 05/31/2011, 10:42 AM

## 2011-05-31 NOTE — Progress Notes (Signed)
Orders to d/c fenantyl  PCA. Myself and Melina Schools RN witnessed the waste of the syringe. Pt had not received any of the syringe. Entire syringe was wasted in the sink.

## 2011-05-31 NOTE — Progress Notes (Signed)
Gave 4 mg of Zofran IV for nausea.

## 2011-05-31 NOTE — Progress Notes (Signed)
ANTIBIOTIC CONSULT NOTE - FOLLOW UP  Pharmacy Consult for Vancomycin Indication: PNA and Empyema with MRSA  No Known Allergies  Patient Measurements: Height: 5\' 3"  (160 cm) Weight: 169 lb 15.6 oz (77.1 kg) IBW/kg (Calculated) : 52.4   Vital Signs: Temp: 98.1 F (36.7 C) (04/25 0800) Temp src: Oral (04/25 0800) BP: 139/89 mmHg (04/25 0800) Pulse Rate: 117  (04/25 0345) Intake/Output from previous day: 04/24 0701 - 04/25 0700 In: 1521.4 [P.O.:480; I.V.:541.4; IV Piggyback:500] Out: 890 [Urine:800; Chest Tube:90] Intake/Output from this shift: Total I/O In: 140 [P.O.:120; I.V.:20] Out: 250 [Urine:250]  Labs:  Basename 05/30/11 0410 05/29/11 0500  WBC 17.2* 18.2*  HGB 7.7* 7.8*  PLT 639* 619*  LABCREA -- --  CREATININE -- 0.38*   Estimated Creatinine Clearance: 93.8 ml/min (by C-G formula based on Cr of 0.38).  Basename 05/31/11 0500  VANCOTROUGH 15.4  VANCOPEAK --  VANCORANDOM --  GENTTROUGH --  GENTPEAK --  GENTRANDOM --  TOBRATROUGH --  TOBRAPEAK --  TOBRARND --  AMIKACINPEAK --  AMIKACINTROU --  AMIKACIN --     Microbiology: Recent Results (from the past 720 hour(s))  SURGICAL PCR SCREEN     Status: Abnormal   Collection Time   05/25/11  3:14 PM      Component Value Range Status Comment   MRSA, PCR POSITIVE (*) NEGATIVE  Final    Staphylococcus aureus POSITIVE (*) NEGATIVE  Final   TISSUE CULTURE     Status: Normal   Collection Time   05/26/11 11:22 AM      Component Value Range Status Comment   Specimen Description TISSUE   Final    Special Requests PT ON VANC,ZOSYN LEFT PLEURAL PEEL   Final    Gram Stain     Final    Value: MODERATE WBC PRESENT, PREDOMINANTLY PMN     FEW GRAM POSITIVE COCCI     IN CLUSTERS   Culture     Final    Value: MODERATE STAPHYLOCOCCUS AUREUS     Note: RIFAMPIN AND GENTAMICIN SHOULD NOT BE USED AS SINGLE DRUGS FOR TREATMENT OF STAPH INFECTIONS. This organism DOES NOT demonstrate inducible Clindamycin resistance in vitro.    Report Status 05/28/2011 FINAL   Final    Organism ID, Bacteria STAPHYLOCOCCUS AUREUS   Final   BODY FLUID CULTURE     Status: Normal   Collection Time   05/26/11 11:25 AM      Component Value Range Status Comment   Specimen Description PLEURAL FLUID LEFT   Final    Special Requests PT ON VANC,ZOSYN   Final    Gram Stain     Final    Value: MODERATE WBC PRESENT, PREDOMINANTLY PMN     FEW GRAM POSITIVE COCCI IN CLUSTERS   Culture     Final    Value: MODERATE METHICILLIN RESISTANT STAPHYLOCOCCUS AUREUS     Note: RIFAMPIN AND GENTAMICIN SHOULD NOT BE USED AS SINGLE DRUGS FOR TREATMENT OF STAPH INFECTIONS. This organism DOES NOT demonstrate inducible Clindamycin resistance in vitro. CRITICAL RESULT CALLED TO, READ BACK BY AND VERIFIED WITH: CHRISTINE HINSHAW      @ 0945 05/28/11 BY KRAWS   Report Status 05/28/2011 FINAL   Final    Organism ID, Bacteria METHICILLIN RESISTANT STAPHYLOCOCCUS AUREUS   Final   ANAEROBIC CULTURE     Status: Normal (Preliminary result)   Collection Time   05/26/11 11:25 AM      Component Value Range Status Comment   Specimen Description  PLEURAL FLUID LEFT   Final    Special Requests PT ON VANC,ZOSYN   Final    Gram Stain PENDING   Incomplete    Culture     Final    Value: NO ANAEROBES ISOLATED; CULTURE IN PROGRESS FOR 5 DAYS   Report Status PENDING   Incomplete    Assessment: 36 yof on Vancomycin Day 7/28 mininum for MRSA PNA and empyema. She has a pericardial effusion on echo.   Pt had Tmax 98.1 and WBC 17.2 yesterday. . Vancomycin trough drawn this AM was therapeutic at 15.4 mcg/ml. Dose was increased to 1250 mg IV q8h on 4/22 from 750 mg q8h. Therapeutic vancomycin trough. Clinically stable.   al of Therapy:  Vancomycin trough level 15-20 mcg/ml  Plan:  1. Continue Vancomycin to 1250mg  IV q8h  2. Continue to monitor renal function, UOP  Herby Abraham, Pharm.D. [phone] 05/31/2011 9:35 AM

## 2011-05-31 NOTE — Progress Notes (Addendum)
                    [address]            [place] [address]          [phone]     5 Days Post-Op Procedure(s) (LRB): VIDEO ASSISTED THORACOSCOPY (VATS)/EMPYEMA (Left) THORACOTOMY MAJOR (Left)  Subjective: Feels well. Breathing stable.  Some cough but little sputum.  No BM.  Objective: Vital signs in last 24 hours: Patient Vitals for the past 24 hrs:  BP Temp Temp src Pulse Resp SpO2  05/31/11 0345 143/83 mmHg 98.7 F (37.1 C) Oral 117  22  95 %  05/31/11 0015 148/96 mmHg 98.2 F (36.8 C) Oral 98  23  97 %  05/30/11 2000 - 97.5 F (36.4 C) Oral - 24  97 %  05/30/11 1600 142/88 mmHg 97.2 F (36.2 C) Oral 98  26  97 %  05/30/11 1335 131/97 mmHg 98.6 F (37 C) Oral 104  21  96 %  05/30/11 1200 142/93 mmHg - - 101  24  96 %  05/30/11 1147 - 98.6 F (37 C) Oral - - -  05/30/11 0900 - - - 103  22  96 %  05/30/11 0800 131/95 mmHg - - 98  23  97 %   Current Weight  05/30/11 77.1 kg (169 lb 15.6 oz)     Intake/Output from previous day: 04/24 0701 - 04/25 0700 In: 1521.4 [P.O.:480; I.V.:541.4; IV Piggyback:500] Out: 890 [Urine:800; Chest Tube:90]    PHYSICAL EXAM:  Heart: RRR Lungs: diminished BS on L Wound: clean and dry Chest tube: no air leak   Lab Results: CBC: Basename 05/30/11 0410 05/29/11 0500  WBC 17.2* 18.2*  HGB 7.7* 7.8*  HCT 24.5* 24.2*  PLT 639* 619*   BMET:  Basename 05/29/11 0500  NA 137  K 3.8  CL 101  CO2 28  GLUCOSE 87  BUN 3*  CREATININE 0.38*  CALCIUM 8.4    CXR: stable, no obvious ptx  Assessment/Plan: S/P Procedure(s) (LRB): VIDEO ASSISTED THORACOSCOPY (VATS)/EMPYEMA (Left) THORACOTOMY MAJOR (Left) Stable, doing well overall. Possibly d/c another CT today. Continue pulm toilet, wean O2 to off (still on 1L), ambulate. LOC today. MRSA pneumonia- continue IV Vanc x 4 weeks per ID.   LOS: 6 days    COLLINS,GINA H 05/31/2011   agree with above

## 2011-05-31 NOTE — Progress Notes (Signed)
Quick Note:  LMTCB ______ 

## 2011-06-01 ENCOUNTER — Inpatient Hospital Stay (HOSPITAL_COMMUNITY): Payer: Managed Care, Other (non HMO)

## 2011-06-01 DIAGNOSIS — I319 Disease of pericardium, unspecified: Secondary | ICD-10-CM

## 2011-06-01 LAB — BASIC METABOLIC PANEL
Calcium: 8.5 mg/dL (ref 8.4–10.5)
GFR calc Af Amer: >90 mL/min (ref >90)
GFR calc non Af Amer: >90 mL/min (ref >90)
Glucose, Bld: 89 mg/dL (ref 70–99)
Potassium: 3.2 mEq/L — ABNORMAL LOW (ref 3.5–5.1)
Sodium: 139 mEq/L (ref 135–145)

## 2011-06-01 LAB — CBC
Hemoglobin: 8.4 g/dL — ABNORMAL LOW (ref 12.0–15.0)
MCH: 26.3 pg (ref 26.0–34.0)
MCHC: 32.8 g/dL (ref 30.0–36.0)
Platelets: 704 10*3/uL — ABNORMAL HIGH (ref 150–400)
RDW: 13.7 % (ref 11.5–15.5)

## 2011-06-01 LAB — GLUCOSE, CAPILLARY: Glucose-Capillary: 93 mg/dL (ref 70–99)

## 2011-06-01 MED ORDER — OXYCODONE-ACETAMINOPHEN 5-325 MG PO TABS: 1.0000 | ORAL_TABLET | ORAL | Status: AC | PRN

## 2011-06-01 MED ORDER — SODIUM CHLORIDE 0.9 % IJ SOLN
INTRAMUSCULAR | Status: AC
  Filled 2011-06-01: qty 10

## 2011-06-01 MED ORDER — SODIUM CHLORIDE 0.9 % IV SOLN: INTRAVENOUS | Status: DC

## 2011-06-01 MED ORDER — SODIUM CHLORIDE 0.9 % IV SOLN
600.0000 mg | Freq: Two times a day (BID) | INTRAVENOUS | Status: DC
  Administered 2011-06-01 – 2011-06-02 (×3): 600 mg via INTRAVENOUS
  Filled 2011-06-01 (×6): qty 600

## 2011-06-01 MED ORDER — POTASSIUM CHLORIDE CRYS ER 20 MEQ PO TBCR
40.0000 meq | EXTENDED_RELEASE_TABLET | Freq: Once | ORAL | Status: AC
  Administered 2011-06-01: 40 meq via ORAL
  Filled 2011-06-01: qty 2

## 2011-06-01 MED ORDER — POTASSIUM CHLORIDE CRYS ER 20 MEQ PO TBCR
20.0000 meq | EXTENDED_RELEASE_TABLET | Freq: Two times a day (BID) | ORAL | Status: DC
  Administered 2011-06-01 – 2011-06-03 (×5): 20 meq via ORAL
  Filled 2011-06-01 (×6): qty 1

## 2011-06-01 MED ORDER — SODIUM CHLORIDE 0.9 % IJ SOLN
INTRAMUSCULAR | Status: AC
  Filled 2011-06-01: qty 20

## 2011-06-01 MED ORDER — POLYSACCHARIDE IRON COMPLEX 150 MG PO CAPS: 150.0000 mg | ORAL_CAPSULE | Freq: Every day | ORAL | Status: DC

## 2011-06-01 NOTE — Discharge Summary (Signed)
[address]            [place] [address]          [phone]         Discharge Summary  Name: Taylor [name] DOB: 11-14-1975 35 y.o. MRN: [id number]  Admission Date: 05/25/2011 Discharge Date:    Admitting Diagnosis: Left empyema Left lower lobe pneumonia  Discharge Diagnosis:  Left empyema Left lower lobe pneumonia Ulcerative colitis Small pericardial effusion  Procedures: Procedure(s): LEFT VIDEO ASSISTED THORACOSCOPY (VATS)/THORACOTOMY, DRAINAGE OF EMPYEMA, DECORTICATION- 05/26/2011   HPI:  The patient is a 36 y.o. female who developed an upper respiratory infection at the beginning of April 2013. She was seen by her primary care physician, and a chest x-ray revealed a left lower lobe pneumonia. She was treated with Avelox for 10 days, but continued to run fevers. A repeat chest x-ray revealed loculated fluid on the left. CT scan of the chest confirmed a large loculated left pleural effusion, and a left lower lobe pneumonia with associated empyema, as well as a small right pleural and a pericardial effusion. She was referred to Dr. Sherene Sires and subsequently to Dr. Edwyna Shell for evaluation for thoracic surgery evaluation. He agreed that she would benefit from VATS drainage and decortication.    Hospital Course:  The patient was admitted to [facility] on 05/25/2011. She was started on IV antibiotic therapy.  All risks, benefits and alternatives of surgery were explained in detail, and the patient agreed to proceed. The patient was taken to the operating room and underwent the above procedure.    The postoperative course has generally been uneventful. Cultures were positive for MRSA. An infectious disease consult was obtained and the patient's antibiotic regimen was switched to vancomycin. It was recommended that she continue IV antibiotics for a full 4 week course, then she will be evaluated for further by mouth antibiotic therapy. Also, a 2-D echo  was performed to evaluate her pericardial effusion. This did confirm a small effusion, with no evidence of cardiac tamponade. A repeat echo has been performed on 06/01/2011, but the results are not available at the time of this dictation.  Clinically, she is improving daily. All chest tubes have been removed at this point.  Her follow up chest x-rays have been stable.  Her white blood cell count is trending down.  She has remained afebrile and all vital signs have been stable. It is anticipated that if she continues to remain stable, she will be ready for discharge home in the next 48 hours.  Home health will be arranged for IV antibiotic administration via her PICC line.   Recent vital signs:  Filed Vitals:   06/01/11 1200  BP: 141/80  Pulse:   Temp: 98.1 F (36.7 C)  Resp:     Recent laboratory studies:  CBC: Basename 06/01/11 0415 05/30/11 0410  WBC 12.4* 17.2*  HGB 8.4* 7.7*  HCT 25.6* 24.5*  PLT 704* 639*   BMET:  Basename 06/01/11 0415  NA 139  K 3.2*  CL 99  CO2 31  GLUCOSE 89  BUN 3*  CREATININE 0.49*  CALCIUM 8.5    PT/INR: No results found for this basename: LABPROT,INR in the last 72 hours  Discharge Medications:   Medication List  As of 06/01/2011  1:57 PM   TAKE these medications         FLUoxetine 40  MG capsule   Commonly known as: PROZAC   Take 40 mg by mouth daily.      ibuprofen 200 MG tablet   Commonly known as: ADVIL,MOTRIN   Take 400 mg by mouth every 6 (six) hours as needed. For pain      iron polysaccharides 150 MG capsule   Commonly known as: NIFEREX   Take 1 capsule (150 mg total) by mouth daily.      LIALDA 1.2 G EC tablet   Generic drug: mesalamine   Take 2,400 mg by mouth daily with breakfast.      oxyCODONE-acetaminophen 5-325 MG per tablet   Commonly known as: PERCOCET   Take 1-2 tablets by mouth every 4 (four) hours as needed for pain.      sodium chloride 0.9 % SOLN 250 mL with ceftaroline 600 MG SOLR   600 mg IV every 12  hours X 3 weeks            Discharge Instructions:  The patient is to refrain from driving, heavy lifting or strenuous activity.  May shower daily and clean incisions with soap and water.  May resume regular diet.  Home health nurse has been arranged to administer IV antibiotics via PICC line.   Discharge Orders    Future Appointments: Provider: Department: Dept Phone: Center:   06/06/2011 12:45 PM Ines Bloomer, MD [facility] [phone] [facility]   06/21/2011 2:00 PM Gardiner Barefoot, MD [facility] [phone] [facility]      Follow-up Information    Follow up with Acey Lav, MD. Schedule an appointment as soon as possible for a visit in 2 weeks.   Contact information:   [address] [place] [address] [phone]       Follow up with Cameron Proud, MD on 06/06/2011. (Have a chest xray at 11:45, then see MD at 12:45)    Contact information:   [address] [place] [address] [phone]           Adella Hare 06/01/2011, 1:57 PM

## 2011-06-01 NOTE — Progress Notes (Signed)
   CARE MANAGEMENT NOTE 06/01/2011  Patient:  Taylor [name], Taylor [name]   Account Number:  [id number]  Date Initiated:  05/28/2011  Documentation initiated by:  Zenita Kister  Subjective/Objective Assessment:   PT S/P LT VATS, THORACOTOMY ON 05/25/11.  PTA, PT INDEPENDENT, LIVES WITH [facility].     Action/Plan:   [facility] TO PROVIDE CARE AT DISCHARGE; WILL FOLLOW FOR HOME NEEDS AS PT PROGRESSES.   Anticipated DC Date:  05/31/2011   Anticipated DC Plan:  HOME W HOME HEALTH SERVICES      DC Planning Services  CM consult      [facility] Choice  HOME HEALTH   Choice offered to / List presented to:  C-1 Patient   DME arranged  IV PUMP/EQUIPMENT      DME agency  [facility]     [facility] arranged  HH-1 RN      [facility] agency  [facility]   Status of service:  In process, will continue to follow Medicare Important Message given?   (If response is "NO", the following Medicare IM given date fields will be blank) Date Medicare IM given:   Date Additional Medicare IM given:    Discharge Disposition:  HOME W HOME HEALTH SERVICES  Per UR Regulation:  Reviewed for med. necessity/level of care/duration of stay  If discussed at Long Length of Stay Meetings, dates discussed:    Comments:  06/01/11 Samari Bittinger,RN,BSN 1130 MET WITH PT TO DISCUSS HOME ARRANGEMENTS.  PT STATES SHE HAS A HUSBAND AT HOME, BUT HE WORKS.  SHE PLANS TO DC HOME WITH HER MOM AND DAD IN [place] WHILE ON IV ANTIBIOTICS, AND THAT HER MOTHER CAN ASSIST WITH INFUSIONS. ORIGINALLY , PT TO DC ON IV VANCOMYCIN TID(1 HR INFUSIONS); DR VAN DAM HAS CHANGED TODAY TO TEFLARO BID ( INFUSIONS)--THIS WAS PT'S PREFERENCE DUE TO EASE OF INFUSIONS.  PARENTS ADDRESS: [facility] AND MARYLYNN HUTCHINS [address] [place], [place]  [address] PHONE [phone] PT'S CELL : [phone]  OF NOTE, PT'S FIRST CHOICE FOR HOME CARE WAS [facility] CARE. SPOKE WITH THIS AGENCY, AND THEY COULD NOT ACCEPT THIS CASE DUE TO STAFFING ISSUES.  2ND  CHOICE WAS [facility]. START OF CARE FOR HOME HEALTH 06/04/11.  PT TO RECEIVE BOTH DOSES OF IV ABX ON 4/28 PRIOR TO DC, WITH HH FOLLOW UP ON MONDAY AM.  MD WILL NEED TO LEAVE A RX FOR IV ANTIBIOTIC FOR HH AGENCY.  THANKS.

## 2011-06-01 NOTE — Progress Notes (Addendum)
                    [address]            [place] [address]          [phone]     6 Days Post-Op Procedure(s) (LRB): VIDEO ASSISTED THORACOSCOPY (VATS)/EMPYEMA (Left) THORACOTOMY MAJOR (Left)  Subjective: Feels better today.  +BM.  Breathing stable, less coughing.  Objective: Vital signs in last 24 hours: Patient Vitals for the past 24 hrs:  BP Temp Temp src Pulse Resp SpO2  06/01/11 0316 133/82 mmHg 98.5 F (36.9 C) Oral 86  20  98 %  05/31/11 2324 - 98.3 F (36.8 C) Oral 99  25  93 %  05/31/11 2323 139/85 mmHg - - - - -  05/31/11 2000 135/87 mmHg 98.7 F (37.1 C) Oral 101  24  98 %  05/31/11 1600 142/96 mmHg 97.8 F (36.6 C) Oral - - -  05/31/11 1200 137/85 mmHg 98.4 F (36.9 C) Oral - - -  05/31/11 0800 139/89 mmHg 98.1 F (36.7 C) Oral - - -   Current Weight  05/30/11 77.1 kg (169 lb 15.6 oz)     Intake/Output from previous day: 04/25 0701 - 04/26 0700 In: 1670 [P.O.:1560; I.V.:110] Out: 2100 [Urine:2100]    PHYSICAL EXAM:  Heart:RRR Lungs: BS decreased on L Wound: clean and dry Chest tube: no air leak noted  Lab Results: CBC: Basename 06/01/11 0415 05/30/11 0410  WBC 12.4* 17.2*  HGB 8.4* 7.7*  HCT 25.6* 24.5*  PLT 704* 639*   BMET:  Basename 06/01/11 0415  NA 139  K 3.2*  CL 99  CO2 31  GLUCOSE 89  BUN 3*  CREATININE 0.49*  CALCIUM 8.5    CXR appears stable, no ptx  Assessment/Plan: S/P Procedure(s) (LRB): VIDEO ASSISTED THORACOSCOPY (VATS)/EMPYEMA (Left) THORACOTOMY MAJOR (Left) ID- MRSA pna.  WBC trending down.  Continue IV Vanc x 4 weeks (D#8/28), then change to po abx per ID service.  Will arrange [facility] to continue at home via PICC. Hypokalemia- replace K+ ?d/c remaining CT soon. Small pericardial effusion- 2D echo ordered for today.   LOS: 7 days    COLLINS,GINA H 06/01/2011   doing well. Wbc decreased to 12000. Plan discharge Sunday if can be arranged.

## 2011-06-02 ENCOUNTER — Inpatient Hospital Stay (HOSPITAL_COMMUNITY): Payer: Managed Care, Other (non HMO)

## 2011-06-02 LAB — BASIC METABOLIC PANEL
BUN: 3 mg/dL — ABNORMAL LOW (ref 6–23)
Creatinine, Ser: 0.5 mg/dL (ref 0.50–1.10)
GFR calc non Af Amer: >90 mL/min (ref >90)
Glucose, Bld: 93 mg/dL (ref 70–99)
Potassium: 3.7 mEq/L (ref 3.5–5.1)

## 2011-06-02 MED ORDER — DIPHENHYDRAMINE HCL 50 MG/ML IJ SOLN
25.0000 mg | Freq: Four times a day (QID) | INTRAMUSCULAR | Status: DC | PRN
  Administered 2011-06-02: 25 mg via INTRAVENOUS

## 2011-06-02 MED ORDER — SODIUM CHLORIDE 0.9 % IJ SOLN
INTRAMUSCULAR | Status: AC
  Administered 2011-06-02: 10 mL
  Filled 2011-06-02: qty 10

## 2011-06-02 MED ORDER — HYDROCORTISONE 0.5 % EX CREA
TOPICAL_CREAM | Freq: Every day | CUTANEOUS | Status: DC | PRN
  Filled 2011-06-02: qty 28.35

## 2011-06-02 MED ORDER — SODIUM CHLORIDE 0.9 % IV SOLN
600.0000 mg | Freq: Two times a day (BID) | INTRAVENOUS | Status: DC
  Administered 2011-06-02 – 2011-06-03 (×2): 600 mg via INTRAVENOUS
  Filled 2011-06-02 (×5): qty 600

## 2011-06-02 MED ORDER — DIPHENHYDRAMINE HCL 50 MG/ML IJ SOLN
INTRAMUSCULAR | Status: AC
  Administered 2011-06-02: 25 mg via INTRAVENOUS
  Filled 2011-06-02: qty 1

## 2011-06-02 NOTE — Progress Notes (Signed)
                                              7 Days Post-Op Procedure(s) (LRB): VIDEO ASSISTED THORACOSCOPY (VATS)/EMPYEMA (Left) THORACOTOMY MAJOR (Left) Subjective: Echocardiogram shows a small pericardial effusion. As chest x-ray shows improvement. Plan discharge in the morning repeat chest x-ray in the morning wounds are okay doing better  Objective: Vital signs in last 24 hours: Temp:  [98.1 F (36.7 C)-98.5 F (36.9 C)] 98.4 F (36.9 C) (04/27 0417) Pulse Rate:  [84-92] 84  (04/27 0417) Cardiac Rhythm:  [-] Normal sinus rhythm (04/27 0417) Resp:  [22-24] 22  (04/27 0417) BP: (133-151)/(80-94) 133/84 mmHg (04/27 0417) SpO2:  [96 %-99 %] 96 % (04/27 0417)  Hemodynamic parameters for last 24 hours:    Intake/Output from previous day: 04/26 0701 - 04/27 0700 In: 1000 [P.O.:960; I.V.:40] Out: 1900 [Urine:900; Chest Tube:1000] Intake/Output this shift:    General appearance: alert  Lab Results:  [facility] 06/01/11 0415  WBC 12.4*  HGB 8.4*  HCT 25.6*  PLT 704*   BMET:  Basename 06/02/11 0419 06/01/11 0415  NA 137 139  K 3.7 3.2*  CL 100 99  CO2 30 31  GLUCOSE 93 89  BUN 3* 3*  CREATININE 0.50 0.49*  CALCIUM 8.7 8.5    PT/INR: No results found for this basename: LABPROT,INR in the last 72 hours ABG    Component Value Date/Time   PHART 7.353 05/27/2011 0449   HCO3 27.3* 05/27/2011 0449   TCO2 29 05/27/2011 0449   O2SAT 96.0 05/27/2011 0449   CBG (last 3)   Basename 06/01/11 0739 05/31/11 1708 05/31/11 0734  GLUCAP 93 103* 107*    Assessment/Plan: S/P Procedure(s) (LRB): VIDEO ASSISTED THORACOSCOPY (VATS)/EMPYEMA (Left) THORACOTOMY MAJOR (Left) To discharge in the morning repeat chest x-ray   LOS: 8 days    Aarian Griffie PATRICK 06/02/2011

## 2011-06-03 ENCOUNTER — Inpatient Hospital Stay (HOSPITAL_COMMUNITY): Payer: Managed Care, Other (non HMO)

## 2011-06-03 LAB — CBC
HCT: 26.9 % — ABNORMAL LOW (ref 36.0–46.0)
Hemoglobin: 8.5 g/dL — ABNORMAL LOW (ref 12.0–15.0)
MCH: 25.1 pg — ABNORMAL LOW (ref 26.0–34.0)
MCHC: 31.6 g/dL (ref 30.0–36.0)
RDW: 13.7 % (ref 11.5–15.5)

## 2011-06-03 MED ORDER — SODIUM CHLORIDE 0.9 % IV SOLN: 600.0000 mg | Freq: Two times a day (BID) | INTRAVENOUS | Status: DC

## 2011-06-03 MED ORDER — ONDANSETRON HCL 4 MG PO TABS: 4.0000 mg | ORAL_TABLET | Freq: Three times a day (TID) | ORAL | Status: AC | PRN

## 2011-06-03 MED ORDER — HYDROCORTISONE 0.5 % EX CREA: TOPICAL_CREAM | Freq: Every day | CUTANEOUS | Status: AC | PRN

## 2011-06-03 NOTE — Progress Notes (Signed)
Spoke with home health agency and they stated that a nurse would be available to come tonight and hang her second dose of IV ABT.

## 2011-06-03 NOTE — Progress Notes (Signed)
Pt transferred home, per MD order with home health care set up. All discharge instructions reviewed and all questions answered.

## 2011-06-03 NOTE — Progress Notes (Signed)
   CARE MANAGEMENT NOTE 06/03/2011  Patient:  Taylor [name], Taylor [name]   Account Number:  [id number]  Date Initiated:  05/28/2011  Documentation initiated by:  AMERSON,JULIE  Subjective/Objective Assessment:   PT S/P LT VATS, THORACOTOMY ON 05/25/11.  PTA, PT INDEPENDENT, LIVES WITH SPOUSE.     Action/Plan:   SPOUSE TO PROVIDE CARE AT DISCHARGE; WILL FOLLOW FOR HOME NEEDS AS PT PROGRESSES.   Anticipated DC Date:  05/31/2011   Anticipated DC Plan:  HOME W HOME HEALTH SERVICES      DC Planning Services  CM consult      [facility] Choice  HOME HEALTH   Choice offered to / List presented to:  C-1 Patient   DME arranged  IV PUMP/EQUIPMENT      DME agency  [facility]     [facility] arranged  HH-1 RN  IV Antibiotics      HH agency  [facility]   Status of service:  Completed, signed off Medicare Important Message given?   (If response is "NO", the following Medicare IM given date fields will be blank) Date Medicare IM given:   Date Additional Medicare IM given:    Discharge Disposition:  HOME W HOME HEALTH SERVICES  Per UR Regulation:  Reviewed for med. necessity/level of care/duration of stay  If discussed at Long Length of Stay Meetings, dates discussed:    Comments:  06/03/2011 1000 [facility] aware of pt's scheduled d/c home today with IV abx. Isidoro Donning RN CCM Case Mgmt phone [phone]  06/01/11 JULIE AMERSON,RN,BSN 1130 MET WITH PT TO DISCUSS HOME ARRANGEMENTS.  PT STATES SHE HAS A HUSBAND AT HOME, BUT HE WORKS.  SHE PLANS TO DC HOME WITH HER MOM AND DAD IN [place] WHILE ON IV ANTIBIOTICS, AND THAT HER MOTHER CAN ASSIST WITH INFUSIONS. ORIGINALLY , PT TO DC ON IV VANCOMYCIN TID(1 HR INFUSIONS); DR VAN DAM HAS CHANGED TODAY TO TEFLARO BID ( INFUSIONS)--THIS WAS PT'S PREFERENCE DUE TO EASE OF INFUSIONS.  PARENTS ADDRESS: [facility] AND MARYLYNN HUTCHINS [address] [place], [place]  [address] PHONE [phone] PT'S CELL : [phone]  OF NOTE, PT'S FIRST  CHOICE FOR HOME CARE WAS [facility] CARE. SPOKE WITH THIS AGENCY, AND THEY COULD NOT ACCEPT THIS CASE DUE TO STAFFING ISSUES.  2ND CHOICE WAS [facility]. START OF CARE FOR HOME HEALTH 06/04/11.  PT TO RECEIVE BOTH DOSES OF IV ABX ON 4/28 PRIOR TO DC, WITH HH FOLLOW UP ON MONDAY AM.  MD WILL NEED TO LEAVE A RX FOR IV ANTIBIOTIC FOR HH AGENCY.  THANKS.

## 2011-06-03 NOTE — Progress Notes (Signed)
                                              8 Days Post-Op Procedure(s) (LRB): VIDEO ASSISTED THORACOSCOPY (VATS)/EMPYEMA (Left) THORACOTOMY MAJOR (Left) Subjective: Chest x-ray is stable. Wound is okay. We will DC rest ofclips. White count 17,000. Will continue to monitor. Will discharge today and see in the office Wednesday with chest x-ray and WBC.  Objective: Vital signs in last 24 hours: Temp:  [98.3 F (36.8 C)-98.7 F (37.1 C)] 98.7 F (37.1 C) (04/27 2335) Pulse Rate:  [87-104] 95  (04/28 0400) Cardiac Rhythm:  [-] Normal sinus rhythm (04/28 0400) Resp:  [18-31] 25  (04/27 2335) BP: (127-140)/(85-91) 138/85 mmHg (04/27 2335) SpO2:  [95 %-100 %] 96 % (04/28 0400)  Hemodynamic parameters for last 24 hours:    Intake/Output from previous day: 04/27 0701 - 04/28 0700 In: 970 [P.O.:720; IV Piggyback:250] Out: 1800 [Urine:1800] Intake/Output this shift:    General appearance: alert Lungs: clear to auscultation bilaterally  Lab Results:  Basename 06/03/11 0440 06/01/11 0415  WBC 17.0* 12.4*  HGB 8.5* 8.4*  HCT 26.9* 25.6*  PLT 771* 704*   BMET:  Basename 06/02/11 0419 06/01/11 0415  NA 137 139  K 3.7 3.2*  CL 100 99  CO2 30 31  GLUCOSE 93 89  BUN 3* 3*  CREATININE 0.50 0.49*  CALCIUM 8.7 8.5    PT/INR: No results found for this basename: LABPROT,INR in the last 72 hours ABG    Component Value Date/Time   PHART 7.353 05/27/2011 0449   HCO3 27.3* 05/27/2011 0449   TCO2 29 05/27/2011 0449   O2SAT 96.0 05/27/2011 0449   CBG (last 3)   Basename 06/01/11 0739 05/31/11 1708  GLUCAP 93 103*    Assessment/Plan: S/P Procedure(s) (LRB): VIDEO ASSISTED THORACOSCOPY (VATS)/EMPYEMA (Left) THORACOTOMY MAJOR (Left) Plan discharge today see orders   LOS: 9 days    Taylor [name] PATRICK 06/03/2011

## 2011-06-05 ENCOUNTER — Other Ambulatory Visit: Payer: Self-pay | Admitting: Thoracic Surgery

## 2011-06-05 DIAGNOSIS — D381 Neoplasm of uncertain behavior of trachea, bronchus and lung: Secondary | ICD-10-CM

## 2011-06-06 ENCOUNTER — Other Ambulatory Visit: Payer: Self-pay | Admitting: Thoracic Surgery

## 2011-06-06 ENCOUNTER — Ambulatory Visit (INDEPENDENT_AMBULATORY_CARE_PROVIDER_SITE_OTHER): Payer: Self-pay | Admitting: Thoracic Surgery

## 2011-06-06 ENCOUNTER — Ambulatory Visit
Admission: RE | Admit: 2011-06-06 | Discharge: 2011-06-06 | Disposition: A | Discharge status: Home / Self Care | Payer: Managed Care, Other (non HMO) | Source: Ambulatory Visit | Attending: Thoracic Surgery | Admitting: Thoracic Surgery

## 2011-06-06 ENCOUNTER — Encounter: Payer: Self-pay | Admitting: Thoracic Surgery

## 2011-06-06 VITALS — BP 142/93 | HR 116 | Temp 98.7°F | Resp 16 | Ht 63.0 in | Wt 160.0 lb

## 2011-06-06 DIAGNOSIS — Z09 Encounter for follow-up examination after completed treatment for conditions other than malignant neoplasm: Secondary | ICD-10-CM

## 2011-06-06 DIAGNOSIS — J869 Pyothorax without fistula: Secondary | ICD-10-CM

## 2011-06-06 DIAGNOSIS — D381 Neoplasm of uncertain behavior of trachea, bronchus and lung: Secondary | ICD-10-CM

## 2011-06-06 LAB — CBC
MCH: 24.9 pg — ABNORMAL LOW (ref 26.0–34.0)
MCV: 80.6 fL (ref 78.0–100.0)
Platelets: 892 10*3/uL — ABNORMAL HIGH (ref 150–400)
RDW: 14.2 % (ref 11.5–15.5)
WBC: 17.8 10*3/uL — ABNORMAL HIGH (ref 4.0–10.5)

## 2011-06-06 NOTE — Progress Notes (Signed)
HPI patient returns today after a decortication for empyema. Chest tube sutures were removed. She is doing well overall. She's continued along the common is in. We told her to gradually increase her activities. We will see her back again and weeks with another chest x-ray her incision was well healed we plan to let her returned to work in 4 weeks.   Current Outpatient Prescriptions  Medication Sig Dispense Refill  . FLUoxetine (PROZAC) 40 MG capsule Take 40 mg by mouth daily.      . hydrocortisone cream 0.5 % Apply topically 5 (five) times daily as needed.  30 g  0  . ibuprofen (ADVIL,MOTRIN) 200 MG tablet Take 400 mg by mouth every 6 (six) hours as needed. For pain      . mesalamine (LIALDA) 1.2 G EC tablet Take 2,400 mg by mouth daily with breakfast.      . ondansetron (ZOFRAN) 4 MG tablet Take 1 tablet (4 mg total) by mouth every 8 (eight) hours as needed for nausea.  20 tablet  0  . oxyCODONE-acetaminophen (PERCOCET) 5-325 MG per tablet Take 1-2 tablets by mouth every 4 (four) hours as needed for pain.  30 tablet  0  . sodium chloride 0.9 % SOLN 250 mL with ceftaroline 600 MG SOLR 600 mg Inject 600 mg into the vein every 12 (twelve) hours.  42 Container  0  . sodium chloride 0.9 % SOLN 250 mL with ceftaroline 600 MG SOLR 600 mg IV every 12 hours X 3 weeks      . iron polysaccharides (NIFEREX) 150 MG capsule Take 1 capsule (150 mg total) by mouth daily.  30 capsule  0     Review of Systems: Unchanged lungs were clear to auscultation percussion   Physical Exam lungs are clear to auscultation percussion   Diagnostic Tests: Chest x-ray shows the normal postoperative changes left lower lobe decortication   Impression: Empyema status post drainage and decortication  Plan:

## 2011-06-18 ENCOUNTER — Other Ambulatory Visit: Payer: Self-pay | Admitting: Thoracic Surgery

## 2011-06-18 DIAGNOSIS — J869 Pyothorax without fistula: Secondary | ICD-10-CM

## 2011-06-20 ENCOUNTER — Encounter: Payer: Self-pay | Admitting: Thoracic Surgery

## 2011-06-20 ENCOUNTER — Ambulatory Visit (INDEPENDENT_AMBULATORY_CARE_PROVIDER_SITE_OTHER): Payer: Self-pay | Admitting: Thoracic Surgery

## 2011-06-20 ENCOUNTER — Ambulatory Visit
Admission: RE | Admit: 2011-06-20 | Discharge: 2011-06-20 | Disposition: A | Discharge status: Home / Self Care | Payer: Managed Care, Other (non HMO) | Source: Ambulatory Visit | Attending: Thoracic Surgery | Admitting: Thoracic Surgery

## 2011-06-20 VITALS — BP 143/96 | HR 117 | Temp 98.1°F | Resp 16 | Ht 63.0 in | Wt 150.0 lb

## 2011-06-20 DIAGNOSIS — J869 Pyothorax without fistula: Secondary | ICD-10-CM

## 2011-06-20 DIAGNOSIS — Z09 Encounter for follow-up examination after completed treatment for conditions other than malignant neoplasm: Secondary | ICD-10-CM

## 2011-06-20 NOTE — Progress Notes (Signed)
HPI is returns today for followup. Chest x-ray shows some increased air in the left stomach but the left lower lobe has increased as far as  aeration. There still is a lot of reaction  around the left lower lobe. Incisions are well-healed. Lungs are clear to auscultation percussion. Will release to return to work. We will see her back again in 4 weeks with a chest. She will see infectious disease tomorrow. She is still on antibiotics.  Current Outpatient Prescriptions  Medication Sig Dispense Refill  . FLUoxetine (PROZAC) 40 MG capsule Take 40 mg by mouth daily.      . hydrocortisone cream 0.5 % Apply topically 5 (five) times daily as needed.  30 g  0  . ibuprofen (ADVIL,MOTRIN) 200 MG tablet Take 400 mg by mouth every 6 (six) hours as needed. For pain      . iron polysaccharides (NIFEREX) 150 MG capsule Take 1 capsule (150 mg total) by mouth daily.  30 capsule  0  . mesalamine (LIALDA) 1.2 G EC tablet Take 2,400 mg by mouth daily with breakfast.      . sodium chloride 0.9 % SOLN 250 mL with ceftaroline 600 MG SOLR 600 mg Inject 600 mg into the vein every 12 (twelve) hours.  42 Container  0  . sodium chloride 0.9 % SOLN 250 mL with ceftaroline 600 MG SOLR 600 mg IV every 12 hours X 3 weeks         Review of Systems: Unchanged   Physical Exam lungs are clear attestation percussion   Diagnostic Tests chest x-ray shows a decreased reaction around left lower lobe:   Impression: Empyema left lower lobe status post resection and decortication   Plan: Return in 4 weeks chest x-ray

## 2011-06-21 ENCOUNTER — Ambulatory Visit (INDEPENDENT_AMBULATORY_CARE_PROVIDER_SITE_OTHER): Payer: Managed Care, Other (non HMO) | Admitting: Internal Medicine

## 2011-06-21 ENCOUNTER — Telehealth: Payer: Self-pay | Admitting: *Deleted

## 2011-06-21 ENCOUNTER — Encounter: Payer: Self-pay | Admitting: Internal Medicine

## 2011-06-21 VITALS — BP 157/100 | HR 90 | Temp 97.8°F | Ht 63.0 in | Wt 153.0 lb

## 2011-06-21 DIAGNOSIS — J90 Pleural effusion, not elsewhere classified: Secondary | ICD-10-CM

## 2011-06-21 MED ORDER — CLINDAMYCIN HCL 300 MG PO CAPS: 300.0000 mg | ORAL_CAPSULE | Freq: Four times a day (QID) | ORAL | Status: AC

## 2011-06-21 MED ORDER — SACCHAROMYCES BOULARDII 250 MG PO CAPS: 250.0000 mg | ORAL_CAPSULE | Freq: Two times a day (BID) | ORAL | Status: AC

## 2011-06-21 NOTE — Assessment & Plan Note (Signed)
Clinically she is doing well. Her WBC has been elevated but no fever or chills or any signs of any infection.  She has been cleared for work by surgery. 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 I will have her stop the IV therapy but continue with oral clindamycin. The organism was sensitive clindamycin and this will have good penetration to the lungs. I told her to continue that for at least another month. I did discuss the side effects including diarrhea and told her to let us know she is having any tests problems. She does have ulcerative colitis and I will give her empiric probiotics as well because of this. Will return to see [place] in one month's time after seeing cardiothoracic surgery.

## 2011-06-21 NOTE — Progress Notes (Signed)
Order to remove PICC line obtained from Dr. Luciana Axe. Pt. identified with name and date of birth. PICC dressing removed, site unremarkable. PICC line removed using sterile procedure @ 1430 pm. PICC length equal to that noted in pt's hospital chart of 36 cm. Sterile petroleum gauze + sterile 4X4 applied to PICC site, pressure applied for 10 minutes and covered with Medipore tape as a pressure dressing. Pt. instructed to limit use of arm for 1 hour. Pt. instructed that the pressure dressing should remain in place for 24 hours. Pt/husband verablized understanding of these instructions.

## 2011-06-21 NOTE — Telephone Encounter (Signed)
Called [facility] and spoke with pharmacist, Larita Fife and gave verbal order to end IV antibiotics.  PICC line pulled in clinic today. Wendall Mola CMA

## 2011-06-21 NOTE — Progress Notes (Signed)
  Subjective:    Patient ID: Taylor [name], female    DOB: 29-Jan-1976, 36 y.o.   MRN: [id number]  HPI She comes in for followup of empyema. She did have culture positive drainage with MRSA, sensitive to Bactrim and clindamycin. She has been getting IV Teflaro for about one month. She has been seen by Dr. Edwyna Shell who has cleared her for all activities. She has no shortness of breath, no chest pain, no fever, no chills no rashes. No diarrhea. When of some insomnia she thinks may be related to the antibiotic. She has no further tubes and an drainage has cleared.   Review of Systems  Constitutional: Negative.   Respiratory: Negative for cough, shortness of breath and wheezing.   Cardiovascular: Negative.   Gastrointestinal: Negative for nausea, vomiting, abdominal pain, diarrhea, constipation and blood in stool.  Musculoskeletal: Negative.   Skin: Negative.   Hematological: Negative.   Psychiatric/Behavioral: Positive for sleep disturbance. Negative for dysphoric mood. The patient is not nervous/anxious.        Objective:   Physical Exam  Constitutional: She appears well-developed and well-nourished. No distress.  Cardiovascular: Normal rate, regular rhythm and normal heart sounds.  Exam reveals no gallop and no friction rub.   No murmur heard. Pulmonary/Chest: Effort normal and breath sounds normal. No respiratory distress. She has no wheezes. She has no rales.  Abdominal: Soft. Bowel sounds are normal. She exhibits no distension. There is no tenderness. There is no rebound.  Skin: Skin is warm and dry. No rash noted. No erythema.          Assessment & Plan:

## 2011-06-27 ENCOUNTER — Encounter: Payer: Self-pay | Admitting: Infectious Disease

## 2011-07-04 ENCOUNTER — Encounter: Payer: Self-pay | Admitting: Infectious Disease

## 2011-07-11 ENCOUNTER — Other Ambulatory Visit: Payer: Self-pay | Admitting: Thoracic Surgery

## 2011-07-11 DIAGNOSIS — J869 Pyothorax without fistula: Secondary | ICD-10-CM

## 2011-07-18 ENCOUNTER — Encounter: Payer: Self-pay | Admitting: Thoracic Surgery

## 2011-07-18 ENCOUNTER — Ambulatory Visit (INDEPENDENT_AMBULATORY_CARE_PROVIDER_SITE_OTHER): Payer: Self-pay | Admitting: Thoracic Surgery

## 2011-07-18 ENCOUNTER — Ambulatory Visit
Admission: RE | Admit: 2011-07-18 | Discharge: 2011-07-18 | Disposition: A | Discharge status: Home / Self Care | Payer: Managed Care, Other (non HMO) | Source: Ambulatory Visit | Attending: Thoracic Surgery | Admitting: Thoracic Surgery

## 2011-07-18 VITALS — BP 132/90 | HR 104 | Resp 18 | Ht 63.0 in | Wt 155.0 lb

## 2011-07-18 DIAGNOSIS — J869 Pyothorax without fistula: Secondary | ICD-10-CM

## 2011-07-18 DIAGNOSIS — Z09 Encounter for follow-up examination after completed treatment for conditions other than malignant neoplasm: Secondary | ICD-10-CM

## 2011-07-18 NOTE — Progress Notes (Signed)
HPI issue returns today for followup of her empyema. She has a history of ulcerative colitis. Her chest x-ray shows mild scar in the left costophrenic angle. She still has some dilatation of her stomach which she had prior to her surgery. This is probably secondary to her abdominal pathology. We will let her be followup by her medical Dr. Reesa Chew would have to see her again as needed incisions are well-healed. She is released to full activity.   Current Outpatient Prescriptions  Medication Sig Dispense Refill  . clindamycin (CLEOCIN) 150 MG capsule Take 150 mg by mouth 4 (four) times daily.      Marland Kitchen FLUoxetine (PROZAC) 40 MG capsule Take 40 mg by mouth daily.      . hydrocortisone cream 0.5 % Apply topically 5 (five) times daily as needed.  30 g  0  . ibuprofen (ADVIL,MOTRIN) 200 MG tablet Take 400 mg by mouth every 6 (six) hours as needed. For pain      . iron polysaccharides (NIFEREX) 150 MG capsule Take 1 capsule (150 mg total) by mouth daily.  30 capsule  0  . mesalamine (LIALDA) 1.2 G EC tablet Take 2,400 mg by mouth daily with breakfast.         Review of Systems: Unchanged  Physical Exam lungs are clear auscultation percussion   Diagnostic Tests: Chest x-ray shows some mild scar in the left costophrenic angle on dilatation of the stomach   Impression: History of left chest empyema status post drainage and decortication history of ulcerative colitis Plan: Return as needed

## 2011-07-24 ENCOUNTER — Ambulatory Visit (INDEPENDENT_AMBULATORY_CARE_PROVIDER_SITE_OTHER): Payer: Managed Care, Other (non HMO) | Admitting: Internal Medicine

## 2011-07-24 ENCOUNTER — Encounter: Payer: Self-pay | Admitting: Internal Medicine

## 2011-07-24 VITALS — BP 151/98 | HR 96 | Temp 97.7°F | Ht 63.0 in | Wt 155.0 lb

## 2011-07-24 DIAGNOSIS — J90 Pleural effusion, not elsewhere classified: Secondary | ICD-10-CM

## 2011-07-24 DIAGNOSIS — Z0279 Encounter for issue of other medical certificate: Secondary | ICD-10-CM

## 2011-07-24 NOTE — Progress Notes (Signed)
  Subjective:    Patient ID: Taylor [name], female    DOB: [birth year], 36 y.o.   MRN: [id number]  HPI Here for follow up of her empyema with MRSA.  Completed 1 month of Teflaro and now one month of clindamycin.  Repeat CXR improved.  No fever, no chills, no SOB.  No diarrhea.     Review of Systems  Constitutional: Negative for fever, chills, appetite change, fatigue and unexpected weight change.  Respiratory: Negative for cough, choking, chest tightness, shortness of breath, wheezing and stridor.   Cardiovascular: Negative for chest pain.  Gastrointestinal: Negative for abdominal pain, diarrhea and constipation.  Skin: Negative for rash.       Objective:   Physical Exam  Constitutional: She appears well-developed and well-nourished. No distress.  Cardiovascular: Normal rate, regular rhythm and normal heart sounds.  Exam reveals no gallop and no friction rub.   No murmur heard. Pulmonary/Chest: Effort normal and breath sounds normal. No respiratory distress. She has no wheezes. She has no rales.  Skin: No rash noted.          Assessment & Plan:

## 2011-07-24 NOTE — Assessment & Plan Note (Signed)
Doing clinically well.  Will stop the clindamycin and she can follow up PRN.

## 2013-04-27 ENCOUNTER — Other Ambulatory Visit: Payer: Self-pay | Admitting: Gastroenterology

## 2013-09-05 ENCOUNTER — Encounter: Payer: Self-pay | Admitting: *Deleted

## 2014-03-27 IMAGING — CR DG CHEST 2V
2 series · 2 of 2 positions shown · non-contrast
Comparison: Chest x-ray of 06/20/2011

CLINICAL DATA: Left lung surgery in [place], follow up

CHEST - 2 VIEW

[w chest pa]
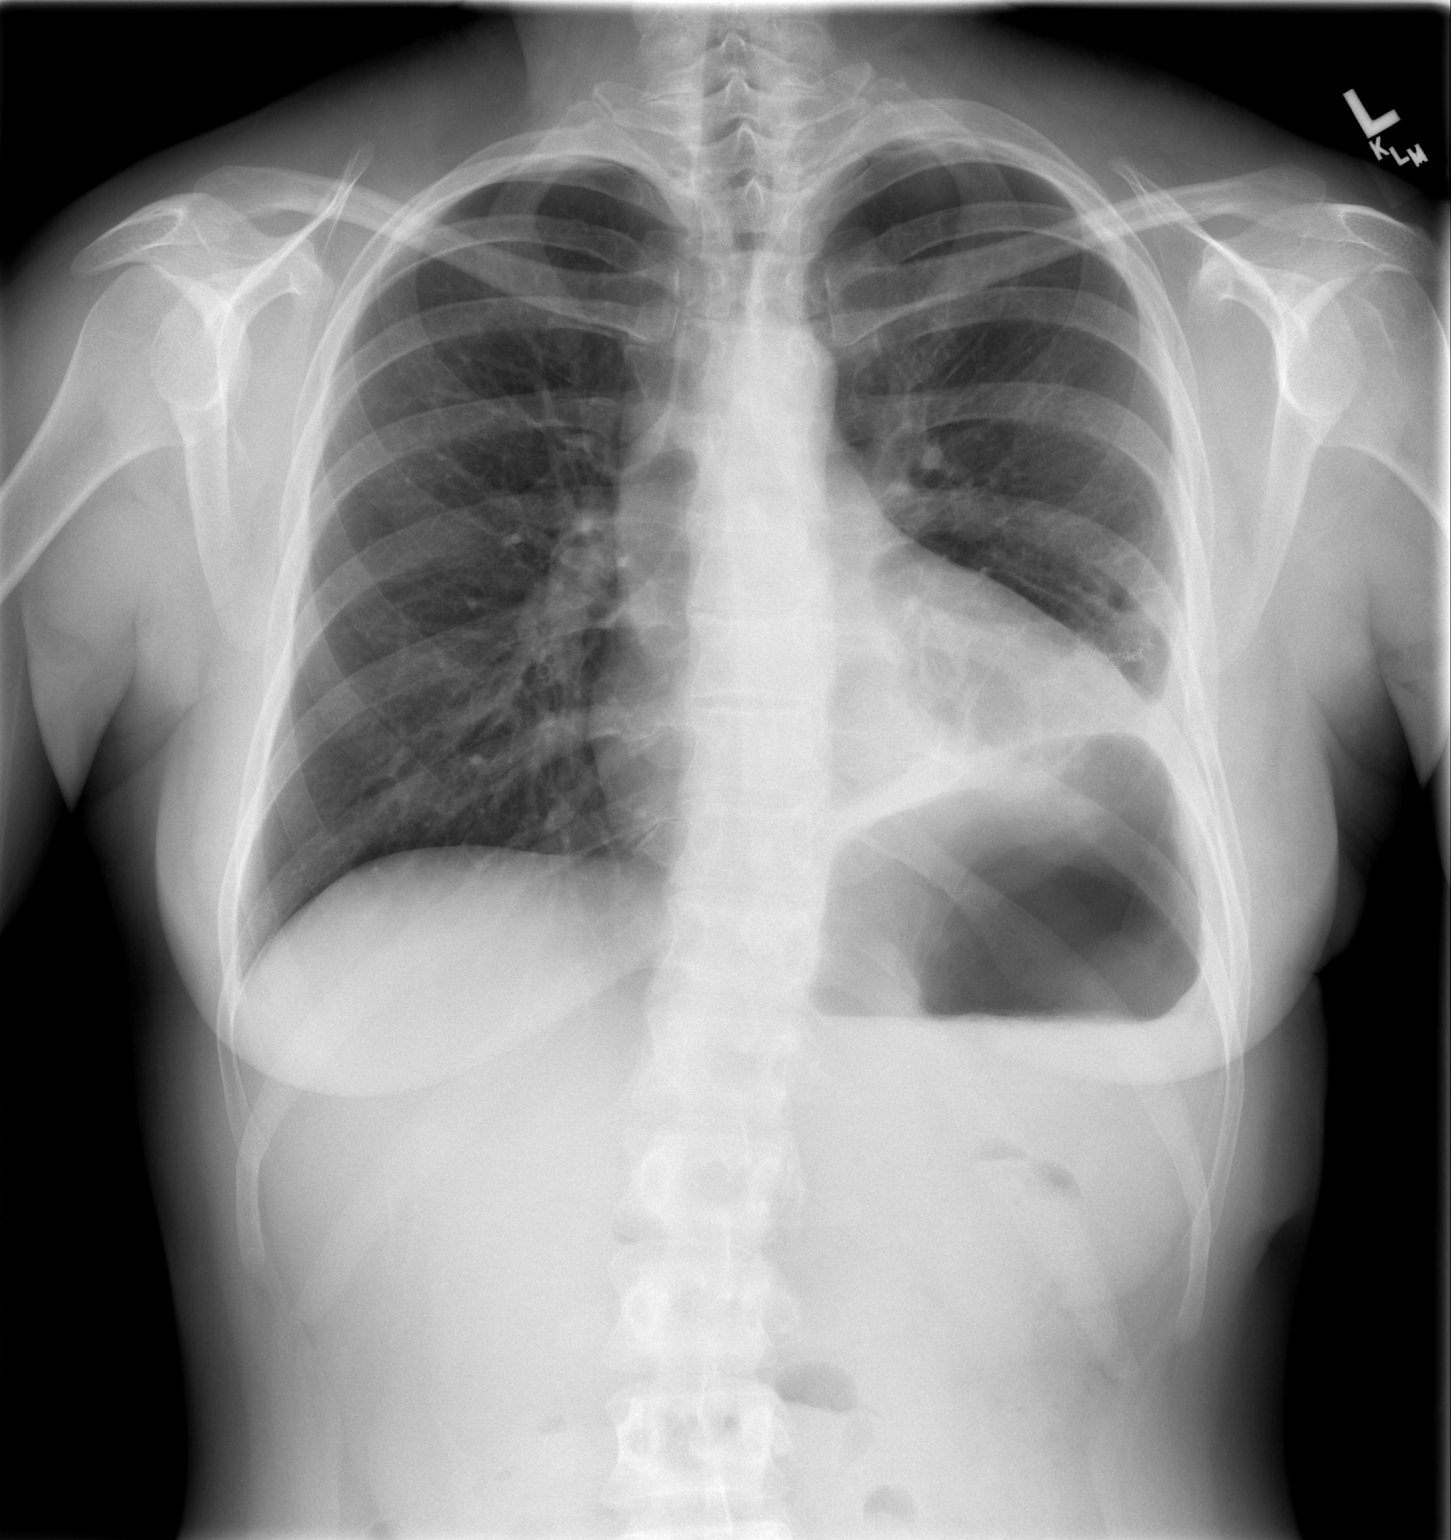

[w chest lat]
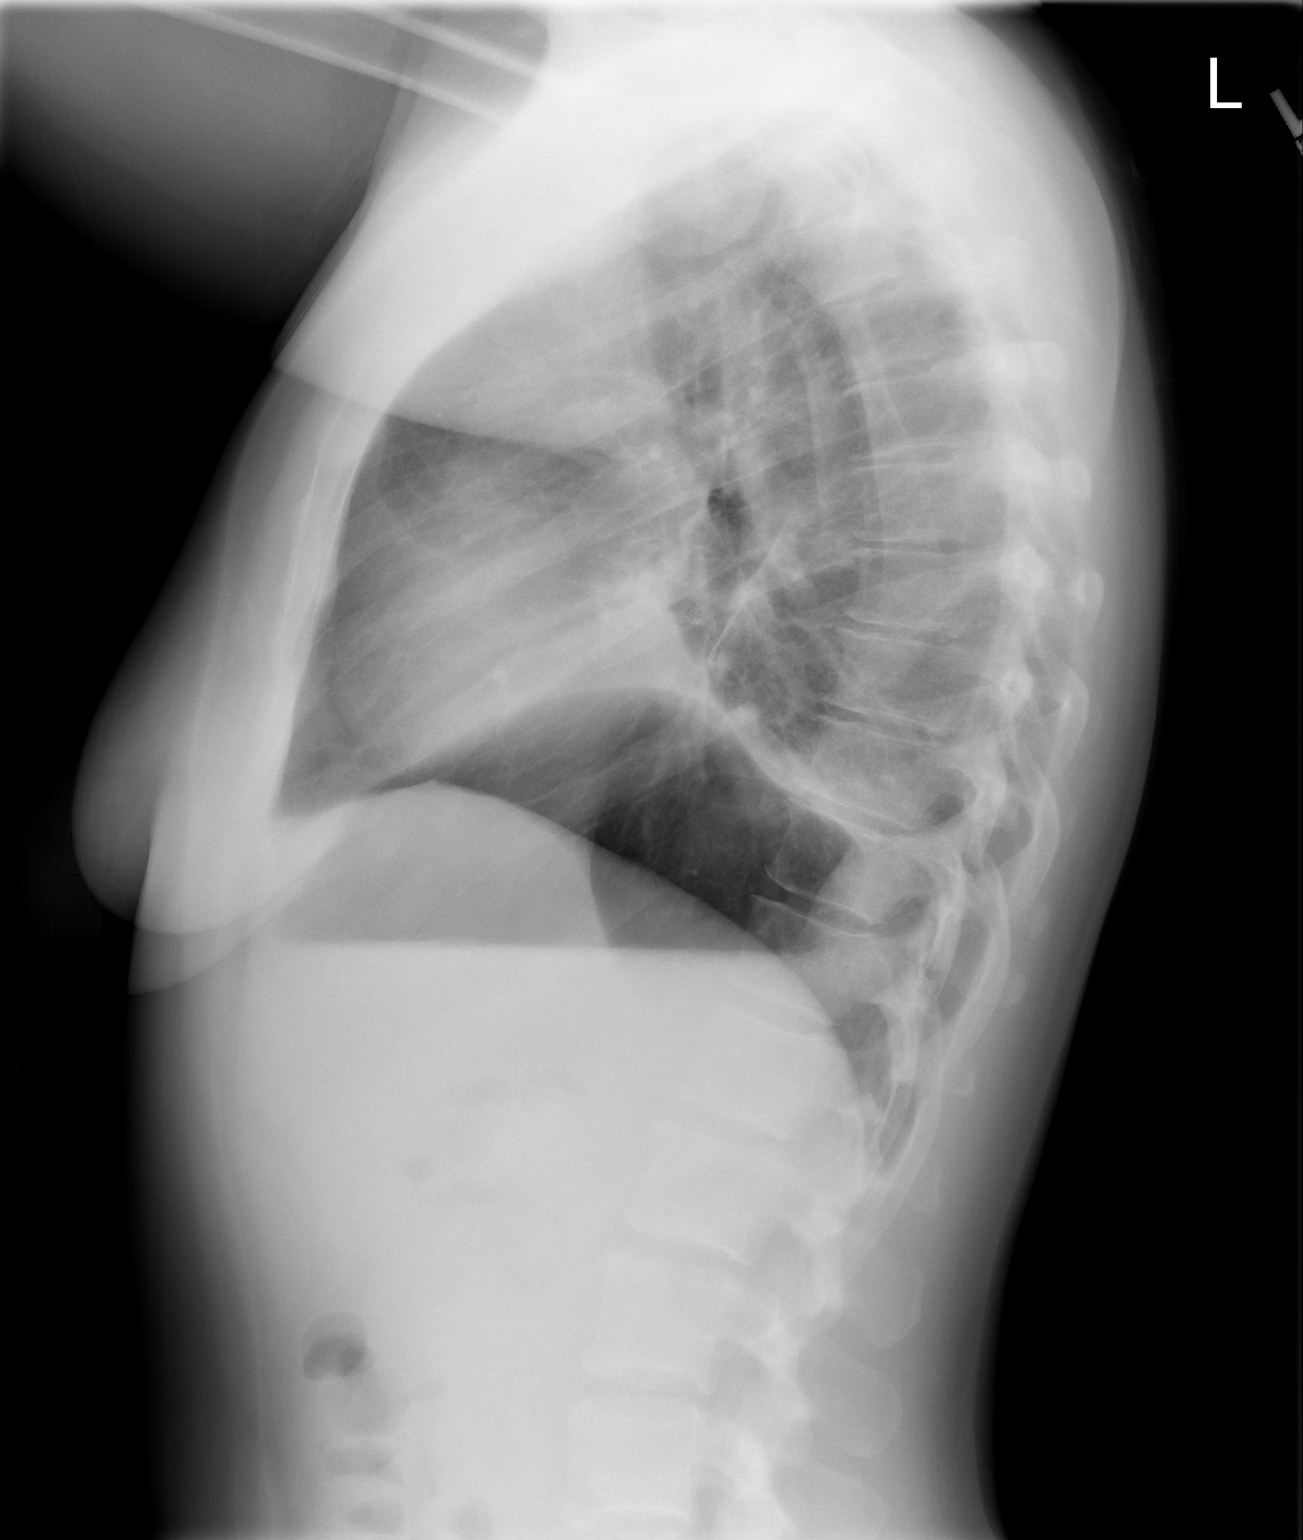

[2 of 2 positions shown; findings below may reference images not displayed]

FINDINGS: Aeration of the left lung base has improved somewhat with
a decrease in atelectasis and small left effusion.  The right lung
is clear.  Cardiomegaly is stable.  Slight elevation of the left
hemidiaphragm persists.  The PICC line has been removed.
IMPRESSION: Improved aeration of the left lung base with persistent basilar
atelectasis and tiny effusion.  Right PICC line removed.

## 2016-03-21 NOTE — H&P (Addendum)
Taylor [name] is an 41 y.o. female G1P0 presents for surgical mngt of missed Ab    Menstrual History: No LMP recorded.    Past Medical History:  Diagnosis Date  . Depression   . Empyema lung   . Essential hypertension, benign   . Ulcerative colitis     Past Surgical History:  Procedure Laterality Date  . LUNG SURGERY    . THORACOTOMY  05/26/2011   Procedure: THORACOTOMY MAJOR;  Surgeon: Nicanor Alcon, MD;  Location: [facility];  Service: Thoracic;  Laterality: Left;  . WISDOM TOOTH EXTRACTION      Family History  Problem Relation Age of Onset  . Emphysema Paternal Grandfather   . Emphysema Paternal Grandmother   . Pancreatic cancer Maternal Grandmother   . Lung cancer Paternal Grandfather   . Lung cancer Paternal Grandmother     Social History:  reports that she has never smoked. She has never used smokeless tobacco. She reports that she drinks alcohol. She reports that she does not use drugs.  Allergies: No Known Allergies  Meds:  Adderall, zoloft, wellbutrin, metoprolol, PNV  ROS  There were no vitals taken for this visit. Physical Exam Gen - NAD Abd - soft, NT/ND Ext - NT, no edema  [place]:  Twin IUP 7.5 wks x 2 .  No FHT  Blood type O+  Assessment/Plan:  Missed Ab D&E  Rambo Sarafian 03/21/2016, 9:14 AM

## 2016-03-22 ENCOUNTER — Encounter (HOSPITAL_COMMUNITY): Payer: Self-pay

## 2016-03-22 MED ORDER — DEXTROSE 5 % IV SOLN
2.0000 g | INTRAVENOUS | Status: AC
  Administered 2016-03-23: 2 g via INTRAVENOUS
  Filled 2016-03-22: qty 2

## 2016-03-22 NOTE — H&P (Addendum)
Taylor [name] is a 41 y.o. female presenting for surgical mngt of missed ab.  No bleeding or pain.  OB History    Gravida Para Term Preterm AB Living   1             SAB TAB Ectopic Multiple Live Births                 Past Medical History:  Diagnosis Date  . Depression   . Empyema lung ([place])   . Essential hypertension, benign   . Ulcerative colitis    Past Surgical History:  Procedure Laterality Date  . LUNG SURGERY    . THORACOTOMY  05/26/2011   Procedure: THORACOTOMY MAJOR;  Surgeon: Nicanor Alcon, MD;  Location: [place];  Service: Thoracic;  Laterality: Left;  . WISDOM TOOTH EXTRACTION     Family History: family history includes Emphysema in her paternal grandfather and paternal grandmother; Lung cancer in her paternal grandfather and paternal grandmother; Pancreatic cancer in her maternal grandmother. Social History:  reports that she has never smoked. She has never used smokeless tobacco. She reports that she drinks alcohol. She reports that she does not use drugs.     ROS History   Last menstrual period 01/20/2016. Exam Physical Exam  Gen - NAD ABd =- soft, NT/nD Ext - NT Cvx closed Prenatal labs: ABO, Rh:  O+ Antibody:   Rubella:   RPR:    HBsAg:    HIV:    GBS:     PV [place]:  Twin IUP, no FHT x 2  Assessment/Plan:  Missed Ab,  D&E with ultrasound guidance Plan of care reviewed, questions answered, informed consent    Sajjad Honea 03/22/2016, 2:20 PM

## 2016-03-23 ENCOUNTER — Ambulatory Visit (HOSPITAL_COMMUNITY): Payer: Managed Care, Other (non HMO)

## 2016-03-23 ENCOUNTER — Ambulatory Visit (HOSPITAL_COMMUNITY): Payer: Managed Care, Other (non HMO) | Admitting: Anesthesiology

## 2016-03-23 ENCOUNTER — Encounter (HOSPITAL_COMMUNITY)
Admission: RE | Disposition: A | Discharge status: Home / Self Care | Payer: Self-pay | Source: Ambulatory Visit | Attending: Obstetrics and Gynecology

## 2016-03-23 ENCOUNTER — Ambulatory Visit (HOSPITAL_COMMUNITY)
Admission: RE | Admit: 2016-03-23 | Discharge: 2016-03-23 | Disposition: A | Discharge status: Home / Self Care | Payer: Managed Care, Other (non HMO) | Source: Ambulatory Visit | Attending: Obstetrics and Gynecology | Admitting: Obstetrics and Gynecology

## 2016-03-23 ENCOUNTER — Encounter (HOSPITAL_COMMUNITY): Payer: Self-pay

## 2016-03-23 DIAGNOSIS — O021 Missed abortion: Secondary | ICD-10-CM | POA: Insufficient documentation

## 2016-03-23 DIAGNOSIS — I10 Essential (primary) hypertension: Secondary | ICD-10-CM | POA: Diagnosis not present

## 2016-03-23 DIAGNOSIS — F329 Major depressive disorder, single episode, unspecified: Secondary | ICD-10-CM | POA: Insufficient documentation

## 2016-03-23 HISTORY — PX: DILATION AND EVACUATION: SHX1459

## 2016-03-23 LAB — SURGICAL PCR SCREEN
MRSA, PCR: NEGATIVE
STAPHYLOCOCCUS AUREUS: POSITIVE — AB

## 2016-03-23 LAB — CBC
HCT: 37.6 % (ref 36.0–46.0)
Hemoglobin: 12.6 g/dL (ref 12.0–15.0)
MCH: 27.3 pg (ref 26.0–34.0)
MCHC: 33.5 g/dL (ref 30.0–36.0)
MCV: 81.6 fL (ref 78.0–100.0)
PLATELETS: 297 10*3/uL (ref 150–400)
RBC: 4.61 MIL/uL (ref 3.87–5.11)
RDW: 14.6 % (ref 11.5–15.5)
WBC: 9.8 10*3/uL (ref 4.0–10.5)

## 2016-03-23 LAB — TYPE AND SCREEN
ABO/RH(D): O POS
Antibody Screen: NEGATIVE

## 2016-03-23 LAB — ABO/RH: ABO/RH(D): O POS

## 2016-03-23 SURGERY — DILATION AND EVACUATION, UTERUS
Anesthesia: Monitor Anesthesia Care | Site: Vagina

## 2016-03-23 MED ORDER — MIDAZOLAM HCL 2 MG/2ML IJ SOLN
INTRAMUSCULAR | Status: AC
  Filled 2016-03-23: qty 2

## 2016-03-23 MED ORDER — LIDOCAINE HCL (CARDIAC) 20 MG/ML IV SOLN
INTRAVENOUS | Status: DC | PRN
  Administered 2016-03-23: 50 mg via INTRAVENOUS

## 2016-03-23 MED ORDER — FENTANYL CITRATE (PF) 100 MCG/2ML IJ SOLN
INTRAMUSCULAR | Status: DC | PRN
  Administered 2016-03-23: 100 ug via INTRAVENOUS

## 2016-03-23 MED ORDER — MIDAZOLAM HCL 2 MG/2ML IJ SOLN
INTRAMUSCULAR | Status: DC | PRN
  Administered 2016-03-23: 2 mg via INTRAVENOUS

## 2016-03-23 MED ORDER — LACTATED RINGERS IV SOLN
INTRAVENOUS | Status: DC
  Administered 2016-03-23: 125 mL/h via INTRAVENOUS

## 2016-03-23 MED ORDER — DEXAMETHASONE SODIUM PHOSPHATE 10 MG/ML IJ SOLN
INTRAMUSCULAR | Status: DC | PRN
  Administered 2016-03-23: 4 mg via INTRAVENOUS

## 2016-03-23 MED ORDER — SCOPOLAMINE 1 MG/3DAYS TD PT72
1.0000 | MEDICATED_PATCH | Freq: Once | TRANSDERMAL | Status: DC
  Administered 2016-03-23: 1.5 mg via TRANSDERMAL

## 2016-03-23 MED ORDER — KETOROLAC TROMETHAMINE 30 MG/ML IJ SOLN
INTRAMUSCULAR | Status: AC
  Filled 2016-03-23: qty 1

## 2016-03-23 MED ORDER — ONDANSETRON HCL 4 MG/2ML IJ SOLN
INTRAMUSCULAR | Status: AC
  Filled 2016-03-23: qty 2

## 2016-03-23 MED ORDER — MEPERIDINE HCL 25 MG/ML IJ SOLN: 6.2500 mg | INTRAMUSCULAR | Status: DC | PRN

## 2016-03-23 MED ORDER — IBUPROFEN 600 MG PO TABS: 600.0000 mg | ORAL_TABLET | Freq: Four times a day (QID) | ORAL | 1 refills | Status: DC | PRN

## 2016-03-23 MED ORDER — PROPOFOL 10 MG/ML IV BOLUS
INTRAVENOUS | Status: DC | PRN
  Administered 2016-03-23: 150 mg via INTRAVENOUS

## 2016-03-23 MED ORDER — PROPOFOL 10 MG/ML IV BOLUS
INTRAVENOUS | Status: AC
  Filled 2016-03-23: qty 20

## 2016-03-23 MED ORDER — ONDANSETRON HCL 4 MG/2ML IJ SOLN
INTRAMUSCULAR | Status: DC | PRN
  Administered 2016-03-23: 4 mg via INTRAVENOUS

## 2016-03-23 MED ORDER — OXYCODONE-ACETAMINOPHEN 5-325 MG PO TABS: 2.0000 | ORAL_TABLET | ORAL | 0 refills | Status: DC | PRN

## 2016-03-23 MED ORDER — FENTANYL CITRATE (PF) 100 MCG/2ML IJ SOLN
INTRAMUSCULAR | Status: AC
  Filled 2016-03-23: qty 2

## 2016-03-23 MED ORDER — OXYCODONE HCL 5 MG PO TABS: 5.0000 mg | ORAL_TABLET | Freq: Once | ORAL | Status: DC | PRN

## 2016-03-23 MED ORDER — KETOROLAC TROMETHAMINE 30 MG/ML IJ SOLN
INTRAMUSCULAR | Status: DC | PRN
  Administered 2016-03-23: 30 mg via INTRAVENOUS

## 2016-03-23 MED ORDER — ACETAMINOPHEN 325 MG PO TABS: 325.0000 mg | ORAL_TABLET | ORAL | Status: DC | PRN

## 2016-03-23 MED ORDER — SCOPOLAMINE 1 MG/3DAYS TD PT72
MEDICATED_PATCH | TRANSDERMAL | Status: AC
  Filled 2016-03-23: qty 1

## 2016-03-23 MED ORDER — LIDOCAINE HCL (CARDIAC) 20 MG/ML IV SOLN
INTRAVENOUS | Status: AC
  Filled 2016-03-23: qty 5

## 2016-03-23 MED ORDER — CHLOROPROCAINE HCL 1 % IJ SOLN
INTRAMUSCULAR | Status: AC
  Filled 2016-03-23: qty 30

## 2016-03-23 MED ORDER — FENTANYL CITRATE (PF) 100 MCG/2ML IJ SOLN: 25.0000 ug | INTRAMUSCULAR | Status: DC | PRN

## 2016-03-23 MED ORDER — CHLOROPROCAINE HCL 1 % IJ SOLN
INTRAMUSCULAR | Status: DC | PRN
  Administered 2016-03-23: 8 mL

## 2016-03-23 MED ORDER — KETOROLAC TROMETHAMINE 30 MG/ML IJ SOLN: 30.0000 mg | Freq: Once | INTRAMUSCULAR | Status: DC

## 2016-03-23 MED ORDER — ACETAMINOPHEN 160 MG/5ML PO SOLN: 325.0000 mg | ORAL | Status: DC | PRN

## 2016-03-23 MED ORDER — DEXAMETHASONE SODIUM PHOSPHATE 4 MG/ML IJ SOLN
INTRAMUSCULAR | Status: AC
  Filled 2016-03-23: qty 1

## 2016-03-23 MED ORDER — ONDANSETRON HCL 4 MG/2ML IJ SOLN: 4.0000 mg | Freq: Once | INTRAMUSCULAR | Status: DC | PRN

## 2016-03-23 MED ORDER — OXYCODONE HCL 5 MG/5ML PO SOLN: 5.0000 mg | Freq: Once | ORAL | Status: DC | PRN

## 2016-03-23 MED ORDER — DEXTROSE 5 % IV SOLN: 2.0000 g | INTRAVENOUS | Status: DC

## 2016-03-23 SURGICAL SUPPLY — 19 items
CATH ROBINSON RED A/P 16FR (CATHETERS) ×3 IMPLANT
CLOTH BEACON ORANGE TIMEOUT ST (SAFETY) ×3 IMPLANT
DECANTER SPIKE VIAL GLASS SM (MISCELLANEOUS) ×3 IMPLANT
GLOVE BIO SURGEON STRL SZ 6.5 (GLOVE) ×2 IMPLANT
GLOVE BIO SURGEONS STRL SZ 6.5 (GLOVE) ×1
GLOVE BIOGEL PI IND STRL 7.0 (GLOVE) ×2 IMPLANT
GLOVE BIOGEL PI INDICATOR 7.0 (GLOVE) ×4
GOWN STRL REUS W/TWL LRG LVL3 (GOWN DISPOSABLE) ×6 IMPLANT
KIT BERKELEY 1ST TRIMESTER 3/8 (MISCELLANEOUS) ×3 IMPLANT
NS IRRIG 1000ML POUR BTL (IV SOLUTION) ×3 IMPLANT
PACK VAGINAL MINOR WOMEN LF (CUSTOM PROCEDURE TRAY) ×3 IMPLANT
PAD OB MATERNITY 4.3X12.25 (PERSONAL CARE ITEMS) ×3 IMPLANT
PAD PREP 24X48 CUFFED NSTRL (MISCELLANEOUS) ×3 IMPLANT
SET BERKELEY SUCTION TUBING (SUCTIONS) ×3 IMPLANT
TOWEL OR 17X24 6PK STRL BLUE (TOWEL DISPOSABLE) ×6 IMPLANT
VACURETTE 10 RIGID CVD (CANNULA) IMPLANT
VACURETTE 7MM CVD STRL WRAP (CANNULA) IMPLANT
VACURETTE 8 RIGID CVD (CANNULA) ×3 IMPLANT
VACURETTE 9 RIGID CVD (CANNULA) IMPLANT

## 2016-03-23 NOTE — Anesthesia Postprocedure Evaluation (Addendum)
Anesthesia Post Note  Patient: Taylor [name]  Procedure(s) Performed: Procedure(s) (LRB): DILATATION AND EVACUATION with US guidance (N/A)  Patient location during evaluation: PACU Anesthesia Type: MAC Level of consciousness: awake Pain management: pain level controlled Respiratory status: spontaneous breathing Cardiovascular status: stable Postop Assessment: no signs of nausea or vomiting Anesthetic complications: no        Last Vitals:  Vitals:   03/23/16 0830 03/23/16 0845  BP: 112/74 122/79  Pulse: (!) 49 (!) 53  Resp: 17 16  Temp:      Last Pain:  Vitals:   03/23/16 0613  TempSrc: Oral   Pain Goal: Patients Stated Pain Goal: 5 (03/23/16 1031)               Shakai Dolley JR,JOHN Mateo Flow

## 2016-03-23 NOTE — Discharge Instructions (Signed)

## 2016-03-23 NOTE — Anesthesia Preprocedure Evaluation (Addendum)
Anesthesia Evaluation  Patient identified by MRN, date of birth, ID band Patient awake    Reviewed: Allergy & Precautions, NPO status , Patient's Chart, lab work & pertinent test results  Airway Mallampati: I       Dental no notable dental hx.    Pulmonary neg pulmonary ROS,    Pulmonary exam normal        Cardiovascular hypertension, Normal cardiovascular exam     Neuro/Psych negative neurological ROS     GI/Hepatic Neg liver ROS,   Endo/Other  negative endocrine ROS  Renal/GU negative Renal ROS  negative genitourinary   Musculoskeletal   Abdominal Normal abdominal exam  (+)   Peds  Hematology negative hematology ROS (+)   Anesthesia Other Findings   Reproductive/Obstetrics (+) Pregnancy                           Anesthesia Physical Anesthesia Plan  ASA: II  Anesthesia Plan: General   Post-op Pain Management:    Induction: Intravenous  Airway Management Planned: LMA  Additional Equipment:   Intra-op Plan:   Post-operative Plan:   Informed Consent: I have reviewed the patients History and Physical, chart, labs and discussed the procedure including the risks, benefits and alternatives for the proposed anesthesia with the patient or authorized representative who has indicated his/her understanding and acceptance.     Plan Discussed with: CRNA and Surgeon  Anesthesia Plan Comments:        Anesthesia Quick Evaluation

## 2016-03-23 NOTE — Transfer of Care (Signed)
Immediate Anesthesia Transfer of Care Note  Patient: Taylor [name]  Procedure(s) Performed: Procedure(s): DILATATION AND EVACUATION with US guidance (N/A)  Patient Location: PACU  Anesthesia Type:General  Level of Consciousness: awake, alert  and oriented  Airway & Oxygen Therapy: Patient Spontanous Breathing and Patient connected to nasal cannula oxygen  Post-op Assessment: Report given to RN and Post -op Vital signs reviewed and stable  Post vital signs: Reviewed and stable  Last Vitals:  Vitals:   03/23/16 0613  BP: 135/65  Resp: 20  Temp: 36.8 C    Last Pain:  Vitals:   03/23/16 0613  TempSrc: Oral      Patients Stated Pain Goal: 5 ([id number])  Complications: No apparent anesthesia complications

## 2016-03-23 NOTE — Anesthesia Procedure Notes (Signed)
Procedure Name: LMA Insertion Date/Time: 03/23/2016 7:29 AM Performed by: Jonna Munro Pre-anesthesia Checklist: Patient identified, Emergency Drugs available, Suction available, Patient being monitored and Timeout performed Patient Re-evaluated:Patient Re-evaluated prior to inductionOxygen Delivery Method: Circle system utilized Preoxygenation: Pre-oxygenation with 100% oxygen Intubation Type: IV induction LMA: LMA inserted LMA Size: 4.0 Number of attempts: 1 Placement Confirmation: positive ETCO2 and breath sounds checked- equal and bilateral Tube secured with: Tape Dental Injury: Teeth and Oropharynx as per pre-operative assessment

## 2016-03-24 NOTE — Op Note (Signed)
NAMEANILU, COUNCILMAN                ACCOUNT NO.:  [id number]  MEDICAL RECORD NO.:  [id number]  LOCATION:                                 FACILITY:  PHYSICIAN:  Marylynn Pearson, MD         DATE OF BIRTH:  DATE OF PROCEDURE: DATE OF DISCHARGE:                              OPERATIVE REPORT   PREOPERATIVE DIAGNOSIS:  Twin missed AB.  POSTOPERATIVE DIAGNOSIS:  Twin missed AB.  SURGEON:  Marylynn Pearson, MD  PROCEDURES: 1. Ultrasound-guided dilation and evacuation. 2. Paracervical block.  ANESTHESIA:  General.  COMPLICATIONS:  None.  SPECIMEN:  Products of conception.  DESCRIPTION OF PROCEDURE:  The patient was taken to the operating room after informed consent was obtained.  She was given anesthesia, placed in the dorsolithotomy position using Allen stirrups.  She was prepped and draped in sterile fashion.  In and out catheter was used to drain her bladder.  Bivalve speculum was placed in the vagina and a paracervical block was performed.  Single-tooth tenaculum was attached and the cervix was serially dilated using Pratt dilators.  Single-tooth tenaculum was attached to the anterior lip of the cervix.  The cervix was serially dilated and an 8-French suction catheter was inserted and products of conception were removed.  Ultrasound was then performed to ensure no retained products.  Once this was confirmed, a gentle curetting was performed throughout to remove any clots and debris.  a uterine cry was noted throughout. Speculum and tenaculum were removed. The cervix was hemostatic.  Sponge, lap, needle, and instrument counts were correct x2.     Marylynn Pearson, MD     GA/MEDQ  D:  03/23/2016  T:  03/24/2016  Job:  [id number]

## 2016-03-26 ENCOUNTER — Encounter (HOSPITAL_COMMUNITY): Payer: Self-pay | Admitting: Obstetrics and Gynecology

## 2016-07-13 NOTE — Addendum Note (Signed)
Addendum  created 07/13/16 1017 by Lyn Hollingshead, MD   Sign clinical note

## 2016-10-30 LAB — OB RESULTS CONSOLE RUBELLA ANTIBODY, IGM: Rubella: UNDETERMINED

## 2016-10-30 LAB — OB RESULTS CONSOLE HEPATITIS B SURFACE ANTIGEN: Hepatitis B Surface Ag: NEGATIVE

## 2016-10-30 LAB — OB RESULTS CONSOLE GC/CHLAMYDIA
Chlamydia: NEGATIVE
GC PROBE AMP, GENITAL: NEGATIVE

## 2016-10-30 LAB — OB RESULTS CONSOLE ABO/RH: RH TYPE: POSITIVE

## 2016-10-30 LAB — OB RESULTS CONSOLE RPR: RPR: NONREACTIVE

## 2016-10-30 LAB — OB RESULTS CONSOLE ANTIBODY SCREEN: Antibody Screen: NEGATIVE

## 2016-10-30 LAB — OB RESULTS CONSOLE HIV ANTIBODY (ROUTINE TESTING): HIV: NONREACTIVE

## 2017-05-13 ENCOUNTER — Encounter (HOSPITAL_COMMUNITY): Payer: Self-pay | Admitting: *Deleted

## 2017-05-13 ENCOUNTER — Telehealth (HOSPITAL_COMMUNITY): Payer: Self-pay | Admitting: *Deleted

## 2017-05-13 LAB — OB RESULTS CONSOLE GBS: STREP GROUP B AG: POSITIVE

## 2017-05-13 NOTE — Telephone Encounter (Signed)
Preadmission screen  

## 2017-05-14 NOTE — H&P (Signed)
Taylor [name] is a 42 y.o. female presenting for IOL.  Pregnancy complicated by [facility] - currently taking labetalol/ASA.  BP in office range 130s-140s/80-90s.  No HA, n/v, or visual changes.  OB History    Gravida  2   Para      Term      Preterm      AB  1   Living        SAB  1   TAB      Ectopic      Multiple      Live Births             Past Medical History:  Diagnosis Date  . AMA (advanced maternal age) primigravida 41+   . Anxiety   . Depression   . Essential hypertension, benign   . Fibroid   . IBS (irritable bowel syndrome)   . Ulcerative colitis    Past Surgical History:  Procedure Laterality Date  . DILATION AND EVACUATION N/A 03/23/2016   Procedure: DILATATION AND EVACUATION with US guidance;  Surgeon: Marylynn Pearson, MD;  Location: [facility];  Service: Gynecology;  Laterality: N/A;  . LUNG SURGERY    . THORACOTOMY  05/26/2011   Procedure: THORACOTOMY MAJOR;  Surgeon: Nicanor Alcon, MD;  Location: [place];  Service: Thoracic;  Laterality: Left;  . WISDOM TOOTH EXTRACTION     Family History: family history includes Emphysema in her paternal grandfather and paternal grandmother; Lung cancer in her paternal grandfather and paternal grandmother; Migraines in her mother and sister; Pancreatic cancer in her maternal grandmother. Social History:  reports that she has never smoked. She has never used smokeless tobacco. She reports that she drinks alcohol. She reports that she does not use drugs.     Maternal Diabetes: No Genetic Screening: Normal Maternal Ultrasounds/Referrals: Normal Fetal Ultrasounds or other Referrals:  None Maternal Substance Abuse:  No Significant Maternal Medications:  None Significant Maternal Lab Results:  None Other Comments:  None  ROS History   Last menstrual period 08/19/2016. Exam Physical Exam  Prenatal labs: ABO, Rh: O/Positive/-- (09/25 0000) Antibody: Negative (09/25 0000) Rubella: Equivocal (09/25 0000) RPR:  Nonreactive (09/25 0000)  HBsAg: Negative (09/25 0000)  HIV: Non-reactive (09/25 0000)  GBS: Positive (04/08 0000)   [place] 05/10/17:  vtx 2641gms (19%), normal AFV  Assessment/Plan: Admit for cytotec IOL Continue labetalol for HTN Anxiety/depression - continue current meds   Marylynn Pearson 05/14/2017, 4:55 PM

## 2017-05-15 ENCOUNTER — Inpatient Hospital Stay (HOSPITAL_COMMUNITY)
Admission: RE | Admit: 2017-05-15 | Discharge: 2017-05-17 | DRG: 807 | Disposition: A | Discharge status: Home / Self Care | Payer: BLUE CROSS/BLUE SHIELD | Source: Ambulatory Visit | Attending: Obstetrics and Gynecology | Admitting: Obstetrics and Gynecology

## 2017-05-15 ENCOUNTER — Encounter (HOSPITAL_COMMUNITY): Payer: Self-pay

## 2017-05-15 ENCOUNTER — Inpatient Hospital Stay (HOSPITAL_COMMUNITY): Payer: BLUE CROSS/BLUE SHIELD | Admitting: Anesthesiology

## 2017-05-15 DIAGNOSIS — O99344 Other mental disorders complicating childbirth: Secondary | ICD-10-CM | POA: Diagnosis present

## 2017-05-15 DIAGNOSIS — O1002 Pre-existing essential hypertension complicating childbirth: Secondary | ICD-10-CM | POA: Diagnosis present

## 2017-05-15 DIAGNOSIS — O99824 Streptococcus B carrier state complicating childbirth: Secondary | ICD-10-CM | POA: Diagnosis present

## 2017-05-15 DIAGNOSIS — Z3A38 38 weeks gestation of pregnancy: Secondary | ICD-10-CM | POA: Diagnosis not present

## 2017-05-15 DIAGNOSIS — F329 Major depressive disorder, single episode, unspecified: Secondary | ICD-10-CM | POA: Diagnosis present

## 2017-05-15 DIAGNOSIS — F419 Anxiety disorder, unspecified: Secondary | ICD-10-CM | POA: Diagnosis present

## 2017-05-15 DIAGNOSIS — O10919 Unspecified pre-existing hypertension complicating pregnancy, unspecified trimester: Secondary | ICD-10-CM | POA: Diagnosis present

## 2017-05-15 HISTORY — DX: Pneumonia, unspecified organism: J18.9

## 2017-05-15 LAB — CBC
HCT: 35.9 % — ABNORMAL LOW (ref 36.0–46.0)
HEMATOCRIT: 35.2 % — AB (ref 36.0–46.0)
Hemoglobin: 12.3 g/dL (ref 12.0–15.0)
Hemoglobin: 11.6 g/dL — ABNORMAL LOW (ref 12.0–15.0)
MCH: 27.8 pg (ref 26.0–34.0)
MCH: 27.1 pg (ref 26.0–34.0)
MCHC: 33 g/dL (ref 30.0–36.0)
MCHC: 34.3 g/dL (ref 30.0–36.0)
MCV: 81 fL (ref 78.0–100.0)
MCV: 82.2 fL (ref 78.0–100.0)
PLATELETS: 225 10*3/uL (ref 150–400)
Platelets: 243 10*3/uL (ref 150–400)
RBC: 4.43 MIL/uL (ref 3.87–5.11)
RBC: 4.28 MIL/uL (ref 3.87–5.11)
RDW: 12.7 % (ref 11.5–15.5)
RDW: 12.9 % (ref 11.5–15.5)
WBC: 14.3 10*3/uL — AB (ref 4.0–10.5)
WBC: 16.2 10*3/uL — AB (ref 4.0–10.5)

## 2017-05-15 LAB — RPR: RPR Ser Ql: NONREACTIVE

## 2017-05-15 LAB — TYPE AND SCREEN
ABO/RH(D): O POS
ANTIBODY SCREEN: NEGATIVE

## 2017-05-15 LAB — MRSA PCR SCREENING: MRSA BY PCR: NEGATIVE

## 2017-05-15 MED ORDER — PANTOPRAZOLE SODIUM 40 MG PO TBEC
40.0000 mg | DELAYED_RELEASE_TABLET | Freq: Every day | ORAL | Status: DC
  Administered 2017-05-16 – 2017-05-17 (×2): 40 mg via ORAL
  Filled 2017-05-15 (×2): qty 1

## 2017-05-15 MED ORDER — PHENYLEPHRINE 40 MCG/ML (10ML) SYRINGE FOR IV PUSH (FOR BLOOD PRESSURE SUPPORT)
80.0000 ug | PREFILLED_SYRINGE | INTRAVENOUS | Status: DC | PRN
  Filled 2017-05-15: qty 5

## 2017-05-15 MED ORDER — LABETALOL HCL 5 MG/ML IV SOLN: 20.0000 mg | INTRAVENOUS | Status: DC | PRN

## 2017-05-15 MED ORDER — MEASLES, MUMPS & RUBELLA VAC ~~LOC~~ INJ
0.5000 mL | INJECTION | Freq: Once | SUBCUTANEOUS | Status: AC
  Administered 2017-05-16: 0.5 mL via SUBCUTANEOUS
  Filled 2017-05-15: qty 0.5

## 2017-05-15 MED ORDER — BUTORPHANOL TARTRATE 1 MG/ML IJ SOLN
1.0000 mg | INTRAMUSCULAR | Status: DC | PRN
  Administered 2017-05-15: 1 mg via INTRAVENOUS
  Filled 2017-05-15: qty 1

## 2017-05-15 MED ORDER — ONDANSETRON HCL 4 MG/2ML IJ SOLN: 4.0000 mg | Freq: Four times a day (QID) | INTRAMUSCULAR | Status: DC | PRN

## 2017-05-15 MED ORDER — LABETALOL HCL 200 MG PO TABS
200.0000 mg | ORAL_TABLET | Freq: Two times a day (BID) | ORAL | Status: DC
  Administered 2017-05-15: 200 mg via ORAL
  Filled 2017-05-15: qty 1

## 2017-05-15 MED ORDER — HYDRALAZINE HCL 20 MG/ML IJ SOLN: 10.0000 mg | Freq: Once | INTRAMUSCULAR | Status: DC | PRN

## 2017-05-15 MED ORDER — OXYCODONE-ACETAMINOPHEN 5-325 MG PO TABS: 1.0000 | ORAL_TABLET | ORAL | Status: DC | PRN

## 2017-05-15 MED ORDER — ACETAMINOPHEN 325 MG PO TABS: 650.0000 mg | ORAL_TABLET | ORAL | Status: DC | PRN

## 2017-05-15 MED ORDER — LACTATED RINGERS IV SOLN
INTRAVENOUS | Status: DC
  Administered 2017-05-15 (×2): via INTRAVENOUS

## 2017-05-15 MED ORDER — LACTATED RINGERS IV SOLN: 500.0000 mL | INTRAVENOUS | Status: DC | PRN

## 2017-05-15 MED ORDER — EPHEDRINE 5 MG/ML INJ: 10.0000 mg | INTRAVENOUS | Status: DC | PRN

## 2017-05-15 MED ORDER — EPHEDRINE 5 MG/ML INJ
10.0000 mg | INTRAVENOUS | Status: DC | PRN
  Filled 2017-05-15: qty 2

## 2017-05-15 MED ORDER — TERBUTALINE SULFATE 1 MG/ML IJ SOLN
0.2500 mg | Freq: Once | INTRAMUSCULAR | Status: DC | PRN
  Filled 2017-05-15: qty 1

## 2017-05-15 MED ORDER — SIMETHICONE 80 MG PO CHEW: 80.0000 mg | CHEWABLE_TABLET | ORAL | Status: DC | PRN

## 2017-05-15 MED ORDER — PRENATAL MULTIVITAMIN CH
1.0000 | ORAL_TABLET | Freq: Every day | ORAL | Status: DC
  Administered 2017-05-16: 1 via ORAL
  Filled 2017-05-15: qty 1

## 2017-05-15 MED ORDER — ONDANSETRON HCL 4 MG PO TABS: 4.0000 mg | ORAL_TABLET | ORAL | Status: DC | PRN

## 2017-05-15 MED ORDER — OXYCODONE-ACETAMINOPHEN 5-325 MG PO TABS: 2.0000 | ORAL_TABLET | ORAL | Status: DC | PRN

## 2017-05-15 MED ORDER — DIBUCAINE 1 % RE OINT
1.0000 | TOPICAL_OINTMENT | RECTAL | Status: DC | PRN
Start: 2017-05-15 — End: 2017-05-17

## 2017-05-15 MED ORDER — PHENYLEPHRINE 40 MCG/ML (10ML) SYRINGE FOR IV PUSH (FOR BLOOD PRESSURE SUPPORT)
80.0000 ug | PREFILLED_SYRINGE | INTRAVENOUS | Status: DC | PRN
  Filled 2017-05-15: qty 10
  Filled 2017-05-15: qty 5

## 2017-05-15 MED ORDER — LIDOCAINE HCL (PF) 1 % IJ SOLN
INTRAMUSCULAR | Status: DC | PRN
  Administered 2017-05-15: 4 mL

## 2017-05-15 MED ORDER — OXYTOCIN 40 UNITS IN LACTATED RINGERS INFUSION - SIMPLE MED
1.0000 m[IU]/min | INTRAVENOUS | Status: DC
  Administered 2017-05-15: 2 m[IU]/min via INTRAVENOUS
  Filled 2017-05-15: qty 1000

## 2017-05-15 MED ORDER — FENTANYL 2.5 MCG/ML BUPIVACAINE 1/10 % EPIDURAL INFUSION (WH - ANES)
14.0000 mL/h | INTRAMUSCULAR | Status: DC | PRN
  Administered 2017-05-15: 14 mL/h via EPIDURAL
  Filled 2017-05-15: qty 100

## 2017-05-15 MED ORDER — TETANUS-DIPHTH-ACELL PERTUSSIS 5-2.5-18.5 LF-MCG/0.5 IM SUSP: 0.5000 mL | Freq: Once | INTRAMUSCULAR | Status: DC

## 2017-05-15 MED ORDER — SOD CITRATE-CITRIC ACID 500-334 MG/5ML PO SOLN: 30.0000 mL | ORAL | Status: DC | PRN

## 2017-05-15 MED ORDER — FLUOXETINE HCL 40 MG PO CAPS: 40.0000 mg | ORAL_CAPSULE | Freq: Every day | ORAL | Status: DC

## 2017-05-15 MED ORDER — LACTATED RINGERS IV SOLN
INTRAVENOUS | Status: DC
  Administered 2017-05-15: 17:00:00 via INTRAUTERINE

## 2017-05-15 MED ORDER — OXYTOCIN BOLUS FROM INFUSION
500.0000 mL | Freq: Once | INTRAVENOUS | Status: AC
  Administered 2017-05-15: 500 mL via INTRAVENOUS

## 2017-05-15 MED ORDER — SODIUM CHLORIDE 0.9 % IV SOLN
5.0000 10*6.[IU] | Freq: Once | INTRAVENOUS | Status: AC
  Administered 2017-05-15: 5 10*6.[IU] via INTRAVENOUS
  Filled 2017-05-15: qty 5

## 2017-05-15 MED ORDER — MEDROXYPROGESTERONE ACETATE 150 MG/ML IM SUSP: 150.0000 mg | INTRAMUSCULAR | Status: DC | PRN

## 2017-05-15 MED ORDER — BENZOCAINE-MENTHOL 20-0.5 % EX AERO
1.0000 "application " | INHALATION_SPRAY | CUTANEOUS | Status: DC | PRN
  Administered 2017-05-15: 1 via TOPICAL
  Filled 2017-05-15: qty 56

## 2017-05-15 MED ORDER — LACTATED RINGERS IV SOLN
500.0000 mL | Freq: Once | INTRAVENOUS | Status: AC
  Administered 2017-05-15: 500 mL via INTRAVENOUS

## 2017-05-15 MED ORDER — PHENYLEPHRINE 40 MCG/ML (10ML) SYRINGE FOR IV PUSH (FOR BLOOD PRESSURE SUPPORT): 80.0000 ug | PREFILLED_SYRINGE | INTRAVENOUS | Status: DC | PRN

## 2017-05-15 MED ORDER — OXYTOCIN 40 UNITS IN LACTATED RINGERS INFUSION - SIMPLE MED
2.5000 [IU]/h | INTRAVENOUS | Status: DC
  Administered 2017-05-15: 2.5 [IU]/h via INTRAVENOUS

## 2017-05-15 MED ORDER — LIDOCAINE HCL (PF) 1 % IJ SOLN
30.0000 mL | INTRAMUSCULAR | Status: DC | PRN
  Filled 2017-05-15: qty 30

## 2017-05-15 MED ORDER — DIPHENHYDRAMINE HCL 50 MG/ML IJ SOLN: 12.5000 mg | INTRAMUSCULAR | Status: DC | PRN

## 2017-05-15 MED ORDER — WITCH HAZEL-GLYCERIN EX PADS: 1.0000 "application " | MEDICATED_PAD | CUTANEOUS | Status: DC | PRN

## 2017-05-15 MED ORDER — BUPROPION HCL ER (XL) 300 MG PO TB24
300.0000 mg | ORAL_TABLET | Freq: Every day | ORAL | Status: DC
  Administered 2017-05-16 – 2017-05-17 (×2): 300 mg via ORAL
  Filled 2017-05-15 (×2): qty 1

## 2017-05-15 MED ORDER — SENNOSIDES-DOCUSATE SODIUM 8.6-50 MG PO TABS
2.0000 | ORAL_TABLET | ORAL | Status: DC
  Administered 2017-05-16 (×2): 2 via ORAL
  Filled 2017-05-15 (×2): qty 2

## 2017-05-15 MED ORDER — DIPHENHYDRAMINE HCL 25 MG PO CAPS: 25.0000 mg | ORAL_CAPSULE | Freq: Four times a day (QID) | ORAL | Status: DC | PRN

## 2017-05-15 MED ORDER — LACTATED RINGERS IV SOLN: 500.0000 mL | Freq: Once | INTRAVENOUS | Status: DC

## 2017-05-15 MED ORDER — MISOPROSTOL 25 MCG QUARTER TABLET
25.0000 ug | ORAL_TABLET | ORAL | Status: DC | PRN
  Administered 2017-05-15 (×2): 25 ug via VAGINAL
  Filled 2017-05-15 (×3): qty 1

## 2017-05-15 MED ORDER — ONDANSETRON HCL 4 MG/2ML IJ SOLN: 4.0000 mg | INTRAMUSCULAR | Status: DC | PRN

## 2017-05-15 MED ORDER — IBUPROFEN 600 MG PO TABS
600.0000 mg | ORAL_TABLET | Freq: Four times a day (QID) | ORAL | Status: DC
  Administered 2017-05-15 – 2017-05-17 (×6): 600 mg via ORAL
  Filled 2017-05-15 (×7): qty 1

## 2017-05-15 MED ORDER — PENICILLIN G POT IN DEXTROSE 60000 UNIT/ML IV SOLN
3.0000 10*6.[IU] | INTRAVENOUS | Status: DC
  Administered 2017-05-15 (×4): 3 10*6.[IU] via INTRAVENOUS
  Filled 2017-05-15 (×6): qty 50

## 2017-05-15 MED ORDER — LABETALOL HCL 200 MG PO TABS
200.0000 mg | ORAL_TABLET | Freq: Once | ORAL | Status: AC
  Administered 2017-05-15: 200 mg via ORAL
  Filled 2017-05-15: qty 1

## 2017-05-15 MED ORDER — COCONUT OIL OIL: 1.0000 "application " | TOPICAL_OIL | Status: DC | PRN

## 2017-05-15 MED ORDER — SERTRALINE HCL 100 MG PO TABS
100.0000 mg | ORAL_TABLET | Freq: Every day | ORAL | Status: DC
  Administered 2017-05-16 – 2017-05-17 (×2): 100 mg via ORAL
  Filled 2017-05-15 (×2): qty 1

## 2017-05-15 NOTE — Anesthesia Preprocedure Evaluation (Addendum)
Anesthesia Evaluation  Patient identified by MRN, date of birth, ID band Patient awake    Reviewed: Allergy & Precautions, Patient's Chart, lab work & pertinent test results  Airway Mallampati: I       Dental no notable dental hx.    Pulmonary    Pulmonary exam normal        Cardiovascular hypertension, Pt. on home beta blockers Normal cardiovascular exam     Neuro/Psych PSYCHIATRIC DISORDERS Anxiety Depression negative neurological ROS     GI/Hepatic Neg liver ROS, PUD, GERD  Medicated,  Endo/Other  negative endocrine ROS  Renal/GU negative Renal ROS     Musculoskeletal   Abdominal   Peds  Hematology negative hematology ROS (+)   Anesthesia Other Findings   Reproductive/Obstetrics (+) Pregnancy                            Anesthesia Physical Anesthesia Plan  ASA: II  Anesthesia Plan: Epidural   Post-op Pain Management:    Induction:   PONV Risk Score and Plan:   Airway Management Planned:   Additional Equipment:   Intra-op Plan:   Post-operative Plan:   Informed Consent: I have reviewed the patients History and Physical, chart, labs and discussed the procedure including the risks, benefits and alternatives for the proposed anesthesia with the patient or authorized representative who has indicated his/her understanding and acceptance.     Plan Discussed with: CRNA  Anesthesia Plan Comments: (Lab Results      Component                Value               Date                      WBC                      16.2 (H)            05/15/2017                HGB                      11.6 (L)            05/15/2017                HCT                      35.2 (L)            05/15/2017                MCV                      82.2                05/15/2017                PLT                      225                 05/15/2017           )        Anesthesia Quick Evaluation

## 2017-05-15 NOTE — Progress Notes (Signed)
Pt getting uncomfortable w/ ctx.  Some relief with IV stadol  FHT reassuring w/ accels toco Q2 Cvx 2/80/-2 IUPC placed  A/P:  Continue pitocin Epidural prn

## 2017-05-15 NOTE — Progress Notes (Signed)
Pt comfortable w/ epidural  FHT good BTBV w/ variables Toco Q2 Cvx 8/C/0  A/P:  Exp mngt amnioinfusion

## 2017-05-15 NOTE — Progress Notes (Signed)
Pt comfortable w/ epidural  FHT - cat 2, early/variable decels Toco Q2-4 Cvx 5/90/-1  A/P:  Will start amnioinfusion Position changes

## 2017-05-15 NOTE — Progress Notes (Signed)
MD aware of BP, awaiting order for PO labetalol.

## 2017-05-15 NOTE — Progress Notes (Signed)
SVD of vigorous female infant w/ apgars of 8,9.  Placenta delivered spontaneous w/ 3VC.   2nd degree & bilateral periurethral lac repaired w/ 3-0 vicryl rapide.  Fundus firm.  EBL 150cc .

## 2017-05-15 NOTE — Progress Notes (Signed)
MD made aware of severe range BP.  Advised RN that home dose of Labetalol would be restarted.

## 2017-05-15 NOTE — Progress Notes (Signed)
BP improved with labetalol  FHT cat 1 Toco irregular Cvx 1.5/60/-2 AROM - clear  A/P:  Start IV pitocin

## 2017-05-15 NOTE — Anesthesia Pain Management Evaluation Note (Signed)
  CRNA Pain Management Visit Note  Patient: Taylor [name], 42 y.o., female  "Hello I am a member of the anesthesia team at [facility]. We have an anesthesia team available at all times to provide care throughout the hospital, including epidural management and anesthesia for C-section. I don't know your plan for the delivery whether it a natural birth, water birth, IV sedation, nitrous supplementation, doula or epidural, but we want to meet your pain goals."   1.Was your pain managed to your expectations on prior hospitalizations?   No prior hospitalizations  2.What is your expectation for pain management during this hospitalization?     Epidural  3.How can we help you reach that goal?   Record the patient's initial score and the patient's pain goal.   Pain: 5  Pain Goal: 6 [facility] wants you to be able to say your pain was always managed very well.  Jabier Mutton 05/15/2017

## 2017-05-15 NOTE — Anesthesia Procedure Notes (Signed)
Epidural Patient location during procedure: OB Start time: 05/15/2017 2:46 PM End time: 05/15/2017 3:54 PM  Staffing Anesthesiologist: Effie Berkshire, MD Performed: anesthesiologist   Preanesthetic Checklist Completed: patient identified, site marked, surgical consent, pre-op evaluation, timeout performed, IV checked, risks and benefits discussed and monitors and equipment checked  Epidural Patient position: sitting Prep: ChloraPrep Patient monitoring: heart rate, continuous pulse ox and blood pressure Approach: midline Location: L3-L4 Injection technique: LOR saline  Needle:  Needle type: Tuohy  Needle gauge: 17 G Needle length: 9 cm Catheter type: closed end flexible Catheter size: 20 Guage Test dose: negative and 1.5% lidocaine  Assessment Events: blood not aspirated, injection not painful, no injection resistance and no paresthesia  Additional Notes LOR @ 4.5  Patient identified. Risks/Benefits/Options discussed with patient including but not limited to bleeding, infection, nerve damage, paralysis, failed block, incomplete pain control, headache, blood pressure changes, nausea, vomiting, reactions to medications, itching and postpartum back pain. Confirmed with bedside nurse the patient's most recent platelet count. Confirmed with patient that they are not currently taking any anticoagulation, have any bleeding history or any family history of bleeding disorders. Patient expressed understanding and wished to proceed. All questions were answered. Sterile technique was used throughout the entire procedure. Please see nursing notes for vital signs. Test dose was given through epidural catheter and negative prior to continuing to dose epidural or start infusion. Warning signs of high block given to the patient including shortness of breath, tingling/numbness in hands, complete motor block, or any concerning symptoms with instructions to call for help. Patient was given instructions  on fall risk and not to get out of bed. All questions and concerns addressed with instructions to call with any issues or inadequate analgesia.    Reason for block:procedure for pain

## 2017-05-16 LAB — CBC
HEMATOCRIT: 29.5 % — AB (ref 36.0–46.0)
HEMOGLOBIN: 9.8 g/dL — AB (ref 12.0–15.0)
MCH: 27.2 pg (ref 26.0–34.0)
MCHC: 33.2 g/dL (ref 30.0–36.0)
MCV: 81.9 fL (ref 78.0–100.0)
Platelets: 194 10*3/uL (ref 150–400)
RBC: 3.6 MIL/uL — AB (ref 3.87–5.11)
RDW: 12.8 % (ref 11.5–15.5)
WBC: 18.7 10*3/uL — AB (ref 4.0–10.5)

## 2017-05-16 MED ORDER — LABETALOL HCL 200 MG PO TABS
200.0000 mg | ORAL_TABLET | Freq: Once | ORAL | Status: AC
Start: 2017-05-16 — End: 2017-05-16
  Administered 2017-05-16: 200 mg via ORAL
  Filled 2017-05-16: qty 1

## 2017-05-16 MED ORDER — LABETALOL HCL 100 MG PO TABS
100.0000 mg | ORAL_TABLET | Freq: Two times a day (BID) | ORAL | Status: DC
  Administered 2017-05-16: 100 mg via ORAL
  Filled 2017-05-16: qty 1

## 2017-05-16 MED ORDER — LABETALOL HCL 200 MG PO TABS
200.0000 mg | ORAL_TABLET | Freq: Two times a day (BID) | ORAL | Status: DC
  Administered 2017-05-17: 200 mg via ORAL
  Filled 2017-05-16: qty 1

## 2017-05-16 MED ORDER — LABETALOL HCL 200 MG PO TABS
200.0000 mg | ORAL_TABLET | Freq: Once | ORAL | Status: AC
  Administered 2017-05-16: 200 mg via ORAL
  Filled 2017-05-16: qty 1

## 2017-05-16 NOTE — Progress Notes (Signed)
Post Partum Day 1 Subjective: no complaints  Objective: Blood pressure 123/75, pulse 70, temperature 98.5 F (36.9 C), temperature source Oral, resp. rate 17, height 5\' 3"  (1.6 m), weight 156 lb 11.2 oz (71.1 kg), last menstrual period 08/19/2016, SpO2 98 %, unknown if currently breastfeeding.  Physical Exam:  General: alert Lochia: appropriate Uterine Fundus: firm Incision: healing well DVT Evaluation: No evidence of DVT seen on physical exam.  Recent Labs    05/15/17 1342 05/16/17 0516  HGB 11.6* 9.8*  HCT 35.2* 29.5*    Assessment/Plan: Plan for discharge tomorrow   LOS: 1 day   Margarette Asal 05/16/2017, 9:02 AM

## 2017-05-16 NOTE — Lactation Note (Signed)
This note was copied from a baby's chart. Lactation Consultation Note Baby 57 hrs old. Baby took  formula d/t blood sugar serum 40. Wt. 5.12 lbs. Baby hasn't been interested in BF. Spit up brown emesis.  Discussed newborn behavior, feeding habits, I&O, STS, supply and demand. LPI information sheet given and reviewed, as well as supplementing after BF. Encouraged pumping every 3 hrs then hand expressing, and giving colostrum first before formula. Mom states understanding. Mom has small cone shaped breast. Mom had just finished pumping, nipples everted. RN states they are flat. RN gave #16 NS, mom stated baby latched and fed well w/NS. RN set up DEBP, mom pumped w/no colostrum collected. Mom knows to pump q3h for 15-20 min. Hand expression taught w/drop to end of breast noted.  Encouraged mom to call for assistance or questions. [place] brochure given w/resources, support groups and [place] services.  Patient Name: Taylor [name] Date: 05/16/2017 Reason for consult: Initial assessment;Infant < 6lbs   Maternal Data Has patient been taught Hand Expression?: Yes Does the patient have breastfeeding experience prior to this delivery?: No  Feeding Feeding Type: Bottle Fed - Formula  LATCH Score Latch: Too sleepy or reluctant, no latch achieved, no sucking elicited.  Audible Swallowing: None  Type of Nipple: Everted at rest and after stimulation(after pumping)  Comfort (Breast/Nipple): Soft / non-tender  Hold (Positioning): Assistance needed to correctly position infant at breast and maintain latch.  LATCH Score: 4  Interventions Interventions: Breast feeding basics reviewed;Support pillows;Position options;Breast massage;Hand express;Pre-pump if needed;Breast compression;DEBP  Lactation Tools Discussed/Used Tools: Pump Breast pump type: Double-Electric Breast Pump WIC Program: No Pump Review: Setup, frequency, and cleaning;Milk Storage Initiated by:: RN Date initiated::  05/16/17   Consult Status Consult Status: Follow-up Date: 05/16/17 Follow-up type: In-patient    Theodoro Kalata 05/16/2017, 6:03 AM

## 2017-05-16 NOTE — Progress Notes (Signed)
Dr Matthew Saras notified that pt's B/P 149/110.  Mom denies any headache or visual changes. Pt denies epigastric pain.  MOB has a history of chronic hypertension.  Will recheck B/P in approximately one hour as requested by Dr Matthew Saras.  Orders received and medication given

## 2017-05-16 NOTE — Progress Notes (Signed)
MOB was referred for history of depression/anxiety. * Referral screened out by Clinical Social Worker because none of the following criteria appear to apply: ~ History of anxiety/depression during this pregnancy, or of post-partum depression. ~ Diagnosis of anxiety and/or depression within last 3 years OR * MOB's symptoms currently being treated with medication and/or therapy. MOB is currently taking Wellbutrin and Zoloft. No concerns noted in OB record.  Please contact the Clinical Social Worker if needs arise, by [facility] request, or if MOB scores greater than 9/yes to question 10 on [place] Postpartum Depression Screen.  Laurey Arrow, MSW, LCSW Clinical Social Work [phone]

## 2017-05-16 NOTE — Anesthesia Postprocedure Evaluation (Signed)
Anesthesia Post Note  Patient: Taylor [name]  Procedure(s) Performed: AN AD HOC LABOR EPIDURAL     Patient location during evaluation: Mother Baby Anesthesia Type: Epidural Level of consciousness: awake and alert Pain management: pain level controlled Vital Signs Assessment: post-procedure vital signs reviewed and stable Respiratory status: spontaneous breathing, nonlabored ventilation and respiratory function stable Cardiovascular status: stable Postop Assessment: no headache, no backache, epidural receding, no apparent nausea or vomiting, patient able to bend at knees and adequate PO intake Anesthetic complications: no    Last Vitals:  Vitals:   05/16/17 0330 05/16/17 0527  BP: 104/65 119/64  Pulse: 69 68  Resp: 18 18  Temp: 36.5 C 36.9 C  SpO2: 95% 98%    Last Pain:  Vitals:   05/16/17 0725  TempSrc:   PainSc: 0-No pain   Pain Goal:                 [employer]

## 2017-05-16 NOTE — Lactation Note (Signed)
This note was copied from a baby's chart. Lactation Consultation Note  Patient Name: Taylor [name] [id number] Date: 05/16/2017 Reason for consult: Follow-up assessment;Infant < 6lbs It has been 7 hours since baby's last feeding.  Baby just returned from circumcision and showing feeding cues.  Assisted mom with positioning baby in football hold on left side.  16 mm nipples shield applied.  Baby latched easily and suckled for 5 minutes.  Instructed to follow with formula supplementation.  Mom has pumped once.  Instructed to pump every 3 hours.  Reviewed pump with mom.  Encouraged to call for assist prn.  Maternal Data    Feeding Feeding Type: Breast Fed Length of feed: 5 min  LATCH Score Latch: Grasps breast easily, tongue down, lips flanged, rhythmical sucking.(with nipple shield)  Audible Swallowing: A few with stimulation  Type of Nipple: Everted at rest and after stimulation  Comfort (Breast/Nipple): Soft / non-tender  Hold (Positioning): Assistance needed to correctly position infant at breast and maintain latch.  LATCH Score: 8  Interventions Interventions: Breast feeding basics reviewed;Assisted with latch;Breast compression;Adjust position;Breast massage;Support pillows  Lactation Tools Discussed/Used Tools: Nipple Shields Nipple shield size: 16   Consult Status Consult Status: Follow-up Date: 05/17/17 Follow-up type: In-patient    Ave Filter 05/16/2017, 2:19 PM

## 2017-05-17 LAB — BIRTH TISSUE RECOVERY COLLECTION (PLACENTA DONATION)

## 2017-05-17 MED ORDER — OXYCODONE-ACETAMINOPHEN 5-325 MG PO TABS: 1.0000 | ORAL_TABLET | ORAL | 0 refills | Status: AC | PRN

## 2017-05-17 MED ORDER — DOCUSATE SODIUM 100 MG PO CAPS: 100.0000 mg | ORAL_CAPSULE | Freq: Two times a day (BID) | ORAL | 2 refills | Status: AC

## 2017-05-17 MED ORDER — IBUPROFEN 600 MG PO TABS: 600.0000 mg | ORAL_TABLET | Freq: Four times a day (QID) | ORAL | 0 refills | Status: AC

## 2017-05-17 NOTE — Discharge Summary (Signed)
Obstetric Discharge Summary Reason for Admission: induction of labor Prenatal Procedures: none Intrapartum Procedures: spontaneous vaginal delivery Postpartum Procedures: none Complications-Operative and Postpartum: 2nd degree perineal laceration Hemoglobin  Date Value Ref Range Status  05/16/2017 9.8 (L) 12.0 - 15.0 g/dL Final   HCT  Date Value Ref Range Status  05/16/2017 29.5 (L) 36.0 - 46.0 % Final    Physical Exam:  General: alert, cooperative and appears stated age 42: appropriate Uterine Fundus: firm Incision: healing well, no significant drainage, no dehiscence, no significant erythema DVT Evaluation: No evidence of DVT seen on physical exam. Negative Homan's sign. No cords or calf tenderness. No significant calf/ankle edema.  Discharge Diagnoses: Term Pregnancy-delivered  Discharge Information: Date: 05/17/2017 Activity: pelvic rest Diet: routine Medications: Ibuprofen, Colace and Percocet Condition: stable Instructions: refer to practice specific booklet Discharge to: home   Newborn Data: Live born female  Birth Weight: 5 lb 11.9 oz (2605 g) APGAR: 8, 9  Newborn Delivery   Birth date/time:  05/15/2017 20:25:00 Delivery type:  Vaginal, Spontaneous     Home with mother.  Tyson Dense 05/17/2017, 8:38 AM

## 2017-05-17 NOTE — Lactation Note (Signed)
This note was copied from a baby's chart. Lactation Consultation Note When Lc entered rm, mom laying supine in bed using DEBP. explained to mom she needed to sit up in bed to pump.  Mom has small nipples, no colostrum noted. Moms breast are thin and loose tissue, w/little breast tissue. Hand expression no colostrum.  Very compressible.  Mom has been latching w/NS d/t being flat. DEBP has everted to short shaft very compressible. Encouraged not to use NS for next feeding.if does well, don't use them any more. Assess breast for transfer.  Parent are giving formula. Not Bf much during the night, FOB giving bottles so mom can sleep. Encouraged mom to pump every three hours to supplement. Reported to RN.  Patient Name: Taylor [name] MAYOK'H Date: 05/17/2017 Reason for consult: Follow-up assessment;Infant < 6lbs;Early term 37-38.6wks   Maternal Data    Feeding Feeding Type: Bottle Fed - Formula  LATCH Score          Comfort (Breast/Nipple): Soft / non-tender        Interventions Interventions: DEBP;Breast compression  Lactation Tools Discussed/Used Tools: Pump   Consult Status Consult Status: Follow-up Date: 05/18/17 Follow-up type: In-patient    Truth Barot, Elta [place] 05/17/2017, 6:08 AM

## 2017-05-17 NOTE — Lactation Note (Signed)
This note was copied from a baby's chart. Lactation Consultation Note  Patient Name: Taylor [name] GURKY'H Date: 05/17/2017 Reason for consult: Follow-up assessment   Baby 47 hours old < 6 lbs. Mother is breastfeeding and supplementing after with formula. Mother states she pumped with no results.  Discussed pumping is for stimulation. Reviewed engorgement care and monitoring voids/stools. Mom encouraged to feed baby 8-12 times/24 hours and with feeding cues at least q 3 hours.  Discussed waking techniques. Mother has DEBP at home.  Suggest post pumping 4-6 times per day if baby continues to be sleepy at the breast. Give volume back to baby at next feeding.    Maternal Data    Feeding Feeding Type: Breast Fed Length of feed: 10 min  LATCH Score          Comfort (Breast/Nipple): Soft / non-tender        Interventions Interventions: DEBP;Breast compression  Lactation Tools Discussed/Used Tools: Pump   Consult Status Consult Status: Complete Date: 05/18/17 Follow-up type: In-patient    Taylor [name] [facility] [place] 05/17/2017, 8:43 AM
